# Patient Record
Sex: Male | Born: 1970 | Race: White | Hispanic: No | Marital: Single | Smoking: Former smoker
Health system: Southern US, Community
[De-identification: ages and names within clinical notes are randomized; demographics above are authoritative.]

## PROBLEM LIST (undated history)

## (undated) DIAGNOSIS — K219 Gastro-esophageal reflux disease without esophagitis: Secondary | ICD-10-CM

## (undated) HISTORY — PX: WISDOM TOOTH EXTRACTION: SHX21

## (undated) HISTORY — DX: Gastro-esophageal reflux disease without esophagitis: K21.9

---

## 2010-03-21 ENCOUNTER — Ambulatory Visit: Payer: Self-pay | Admitting: Internal Medicine

## 2010-03-21 DIAGNOSIS — K219 Gastro-esophageal reflux disease without esophagitis: Secondary | ICD-10-CM

## 2010-03-21 DIAGNOSIS — J4520 Mild intermittent asthma, uncomplicated: Secondary | ICD-10-CM | POA: Insufficient documentation

## 2010-03-21 DIAGNOSIS — J309 Allergic rhinitis, unspecified: Secondary | ICD-10-CM

## 2010-03-28 ENCOUNTER — Ambulatory Visit: Payer: Self-pay | Admitting: Internal Medicine

## 2010-03-31 LAB — CONVERTED CEMR LAB
CO2: 31 meq/L (ref 19–32)
Calcium: 9.6 mg/dL (ref 8.4–10.5)
GFR calc non Af Amer: 89.79 mL/min (ref >60)
HDL: 31 mg/dL — ABNORMAL LOW (ref >39.00)
Potassium: 4.4 meq/L (ref 3.5–5.1)
Sodium: 144 meq/L (ref 135–145)
Total CHOL/HDL Ratio: 5
VLDL: 22.4 mg/dL (ref 0.0–40.0)

## 2010-05-20 ENCOUNTER — Ambulatory Visit: Payer: Self-pay | Admitting: Family Medicine

## 2010-05-20 DIAGNOSIS — M25559 Pain in unspecified hip: Secondary | ICD-10-CM

## 2010-08-19 NOTE — Assessment & Plan Note (Signed)
Summary: ROA FOR FOLLOW-UP/JRR   Vital Signs:  Patient profile:   40 year old male Weight:      168.75 pounds Temp:     98.8 degrees F oral Pulse rate:   64 / minute Pulse rhythm:   regular BP sitting:   122 / 70  (left arm) Cuff size:   regular  Vitals Entered By: Selena Batten Dance CMA Duncan Dull) (May 20, 2010 4:39 PM) CC: Follow up   History of Present Illness: CC: f/u allergies, asthma  asthma - using advair sporadically (3x/wk) and albuterol as needed (very intermittently).    allergies - alllergic to pollens and grasses, weeds, dust.  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  notes allergies worse then, doesn't use mask.  using OTC claritin.  allergy sxs -drainage is main complaint.  notes some improvement with claritin.  has been on flonase in past.    hip pain - around father's day this year injured left hip region, thinks pulled muscle after helping unload gun safes from truck.  Pain worse with walking, not present with running or sitting/laying down.  no snapping of hip.  No radiation of pain, stays anterior hip region.  has tried naproxen and doesn't note much improvement.   -  Date:  04/11/2010    Flu vaccine given at work  Current Medications (verified): 1)  Naprosyn 375 Mg Tabs (Naproxen) .... Two Pills in Am For Knee Pain 2)  Claritin 10 Mg Tabs (Loratadine) .... One Daily For Allergies 3)  Zegerid 20-1100 Mg Caps (Omeprazole-Sodium Bicarbonate) .... As Needed Gerd 4)  Advair Diskus 100-50 Mcg/dose Aepb (Fluticasone-Salmeterol) .... One Puff Two Times A Day 5)  Ventolin Hfa 108 (90 Base) Mcg/act Aers (Albuterol Sulfate) .... 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 No apparent alopecia or balding. Eyes:  No corneal or conjunctival inflammation noted. EOMI. Perrla.  Ears:  External ear exam shows no significant lesions or deformities.  Otoscopic examination reveals clear canals, tympanic membranes are intact bilaterally without bulging, retraction, inflammation or discharge. Hearing is grossly normal bilaterally. Nose:  + nasal discharge Mouth:  Oral mucosa and oropharynx without lesions or exudates.  Teeth in good repair.  + lip rings x 2.  + cobblestoning Neck:  No deformities, masses, or tenderness noted. Lungs:  Normal respiratory effort, chest expands symmetrically. Lungs are clear to auscultation, no crackles or wheezes. Heart:  Normal rate and regular rhythm. S1 and S2 normal without gallop, murmur, click, rub or other extra sounds. Msk:  L hip pain anterior superior iliac spine.  no pain with abduction, extension of hip.  Mild pain with L internal rotation of hip, no pain with ext rotation.  Nontender bilateral greater trochanter, nontender SI joints.  Negative SLR.  No midline back pain. no paraspinous mm tenderness. Pulses:  2+ rad pulses Extremities:  no pedal edema   Impression & Recommendations:  Problem # 1:  ALLERGIC RHINITIS (ICD-477.9) cotninue claritin for now.  not interested in nasal steroid.  to let us know  if worsening.  will likely be issue while exposure continues.  advised could try mask when lawnmowing. His updated medication list for this problem includes:    Claritin 10 Mg Tabs (Loratadine) ..... One daily for allergies  Problem # 2:  ASTHMA (ICD-493.90) stable for now.  using advair intermittently, feels this is enough.  using albuterol  rarely. His updated medication list for this problem includes:    Advair Diskus 100-50 Mcg/dose Aepb (Fluticasone-salmeterol) ..... One puff two times a day    Ventolin Hfa 108 (90 Base) Mcg/act Aers (Albuterol sulfate) .Marland Kitchen... 2 puffs q4-6 hours as needed wheezing  Problem # 3:  HIP PAIN, LEFT (ICD-719.45) x 6 mo now.  treat for now as groin strain with exercises and handout provided, ice, NSAIDs.  RTC if not improved and consider xray vs tensor fascia lata or sartorius tendonitis?  r/o avulsion given chronicity if not improved.  His updated medication list for this problem includes:    Naprosyn 375 Mg Tabs (Naproxen) .Marland Kitchen..Marland Kitchen Two pills in am for knee pain  Complete Medication List: 1)  Naprosyn 375 Mg Tabs (Naproxen) .... Two pills in am for knee pain 2)  Claritin 10 Mg Tabs (Loratadine) .... One daily for allergies 3)  Zegerid 20-1100 Mg Caps (Omeprazole-sodium bicarbonate) .... As needed gerd 4)  Advair Diskus 100-50 Mcg/dose Aepb (Fluticasone-salmeterol) .... One puff two times a day 5)  Ventolin Hfa 108 (90 Base) Mcg/act Aers (Albuterol sulfate) .... 2 puffs q4-6 hours as needed wheezing  Patient Instructions: 1)  Return as needed or in 1 year for complete physical. 2)  Continue advair, albuterol as needed.  Continue claritin as needed. 3)  Return if allergies or asthma start acting up. 4)  Good to see you today, call clinic with questions   Orders Added: 1)  Est. Patient Level IV [43329]    Current Allergies (reviewed today): No known allergies

## 2010-08-19 NOTE — Assessment & Plan Note (Signed)
Summary: NEW PT TO EST/CLE   Vital Signs:  Patient profile:   40 year old male Height:      70 inches Weight:      164 pounds BMI:     23.62 Temp:     98.2 degrees F oral Pulse rate:   64 / minute Pulse rhythm:   regular BP sitting:   118 / 78  (left arm) Cuff size:   regular  Vitals Entered By: Selena Batten Dance CMA Duncan Dull) (March 21, 2010 9:32 AM) CC: New patient to establish care   History of Present Illness: CC establish, asthma  previously saw Dr. at [facility].  1. asthma - dx as child.  12 years ago, placed on advair.  Then asthma improved.  Now last few months notes worsening problem with allergies/asthma.  Notes tightness in chest and wheezing more than previously.  Last hospitalized for asthma 14 years ago, intubated x 1 around age 37.    2. allergies - allergic to pollens and grasses, weeds, dust.  Taking claritin daily (sometimes forgets).  Previously tried Careers adviser.    3. lump on bottom - 25 years now, seems to be growing, wants checked out.  No bleeding, itching.  Preventive Screening-Counseling & Management  Alcohol-Tobacco     Alcohol drinks/day: <1     Smoking Status: quit > 6 months  Caffeine-Diet-Exercise     Caffeine use/day: 3-4 cups (pepsi, tea)      Drug Use:  never.        Blood Transfusions:  no.    Current Medications (verified): 1)  Naprosyn 375 Mg Tabs (Naproxen) .... Two Pills in Am For Knee Pain 2)  Claritin 10 Mg Tabs (Loratadine) .... One Daily For Allergies 3)  Zegerid 20-1100 Mg Caps (Omeprazole-Sodium Bicarbonate) .... As Needed Gerd  Allergies (verified): No Known Drug Allergies  Past History:  Past Medical History: asthma (hosp age 59, intubation x1 age 67) GERD allergic rhinitis  Past Surgical History: wisdom tooth extraction  Family History: Adopted MGF - CAD/MI (s/p stents), CVA Mother - melanoma Parents - EtOH/drugs  No known cancers.  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  The patient denies anorexia, fever, weight loss, weight gain, vision loss, decreased hearing, chest pain, syncope, dyspnea on exertion, peripheral edema, prolonged cough, headaches, hemoptysis, abdominal pain, melena, hematochezia, severe indigestion/heartburn, hematuria, incontinence, muscle weakness, suspicious skin lesions, transient blindness, difficulty walking, depression, and testicular masses.         no nocturia  Physical Exam  General:  Well-developed,well-nourished,in no acute distress; alert,appropriate and cooperative throughout examination Head:  Normocephalic and atraumatic without obvious abnormalities. No apparent alopecia or balding. Eyes:  No corneal or conjunctival inflammation noted. EOMI. Perrla.  Ears:  External ear exam shows no significant lesions or deformities.  Otoscopic examination reveals clear canals, tympanic membranes are intact bilaterally without bulging, retraction, inflammation or discharge. Hearing is grossly normal bilaterally. Nose:  External nasal examination shows no deformity or inflammation. Nasal mucosa are pink and moist without lesions or exudates. Mouth:  Oral mucosa and oropharynx without lesions or exudates.  Teeth in good repair.  + lip rings x 2 Neck:  No deformities, masses,  or tenderness noted. Lungs:  Normal respiratory effort, chest expands symmetrically. Lungs are clear to auscultation, no crackles or wheezes. Heart:  Normal rate and regular rhythm. S1 and S2 normal without gallop, murmur, click, rub or other extra sounds. Abdomen:  Bowel sounds positive,abdomen soft and non-tender without masses, organomegaly or hernias noted. Msk:  No deformity  or scoliosis noted of thoracic or lumbar spine.   Pulses:  2+ rad pulses Extremities:  No clubbing, cyanosis, edema, or deformity noted with normal full range of motion of all joints.   Neurologic:  CN grossly intact, station and gait intact Skin:  Intact without suspicious lesions or rashes.  + fatty tumor left mid buttock   Impression & Recommendations:  Problem # 1:  HEALTH MAINTENANCE EXAM (ICD-V70.0)  Reviewed preventive care protocols, scheduled due services, and updated immunizations.  tetanus today, declines flu - to receive at work.  Problem # 2:  SCREENING FOR LIPOID DISORDERS (ICD-V77.91) check FLP and BMp fasting.  Problem # 3:  ALLERGIC RHINITIS (ICD-477.9)  His updated medication list for this problem includes:    Claritin 10 Mg Tabs (Loratadine) ..... One daily for allergies  Discussed use of allergy medications and environmental measures.   Problem # 4:  ASTHMA (ICD-493.90) rec start alb rescue inhaler, Mandella hold off on advair unless having issues not controlled by alb.  mild intermittent. His updated medication list for this problem includes:    Advair Diskus 100-50 Mcg/dose Aepb (Fluticasone-salmeterol) ..... One puff two times a day    Ventolin Hfa 108 (90 Base) Mcg/act Aers (Albuterol sulfate) .Marland Kitchen... 2 puffs q4-6 hours as needed wheezing  Problem # 5:  GERD (ICD-530.81)  His updated medication list for this problem includes:    Zegerid 20-1100 Mg Caps (Omeprazole-sodium bicarbonate) .Marland Kitchen... As needed gerd  Complete Medication List: 1)  Naprosyn 375 Mg Tabs (Naproxen) .... Two pills in am for knee pain 2)  Claritin 10 Mg Tabs (Loratadine) .... One daily for allergies 3)  Zegerid 20-1100 Mg Caps (Omeprazole-sodium bicarbonate) .... As needed gerd 4)  Advair Diskus 100-50 Mcg/dose Aepb (Fluticasone-salmeterol) .... One puff two times a day 5)  Ventolin Hfa 108 (90 Base) Mcg/act Aers (Albuterol sulfate) .... 2 puffs q4-6 hours as needed wheezing  Other  Orders: Tdap => 61yrs IM (27253) Admin 1st Vaccine (66440)  Patient Instructions: 1)  Please return in 1-2 months for asthma/allergies f/u 2)  Tetanus shot today. 3)  Come back fasting next week 8am for blood work [BMP, FLP v70.0] 4)  Pleasure to meet you today.  Call clinic with questions. Prescriptions: VENTOLIN HFA 108 (90 BASE) MCG/ACT AERS (ALBUTEROL SULFATE) 2 puffs q4-6 hours as needed wheezing  #1 x 3   Entered and Authorized by:   Eustaquio Boyden  MD   Signed by:   Eustaquio Boyden  MD on 03/21/2010   Method used:   [facility] to        [id number] ([facility]       [address]       [place], [place]  [address]       Ph: [phone] or [phone]       Fax: [phone]   RxID:   [id number] ADVAIR DISKUS 100-50 MCG/DOSE AEPB (FLUTICASONE-SALMETEROL) one puff two times a day  #1 x 3   Entered and Authorized by:   Eustaquio Boyden  MD   Signed by:   Eustaquio Boyden  MD on 03/21/2010   Method used:   [facility] to        [id number] ([facility]ail[facility]       [address]       [place], [place]  [address]       Ph: [phone] or [phone]       Fax: [phone]   RxID:   [id number]   Prior Medications: Current Allergies (reviewed today): No known allergies    Prevention & Chronic Care Immunizations   Influenza vaccine: Not documented   Influenza vaccine deferral: Deferred  (03/21/2010)    Tetanus booster: 03/21/2010: Tdap   Tetanus booster due: 03/21/2020    Pneumococcal vaccine: Not documented  Other Screening   Smoking status: quit > 6 months  (03/21/2010)  Lipids   Total Cholesterol: Not documented   LDL: Not documented   LDL Direct: Not documented   HDL: Not documented   Triglycerides: Not documented   Immunizations Administered:  Tetanus Vaccine:    Vaccine Type: Tdap    Site: right deltoid    Mfr: GlaxoSmithKline    Dose: 0.5 ml    Route: IM    Given by: Selena Batten Dance  CMA (AAMA)    Exp. Date: 04/09/2012    Lot #: [id number]    VIS given: 06/06/08 version given March 21, 2010.

## 2010-10-29 ENCOUNTER — Telehealth: Payer: Self-pay | Admitting: *Deleted

## 2010-10-29 DIAGNOSIS — J309 Allergic rhinitis, unspecified: Secondary | ICD-10-CM

## 2010-10-29 MED ORDER — FLUTICASONE PROPIONATE 50 MCG/ACT NA SUSP: 2.0000 | Freq: Every day | NASAL | Status: DC

## 2010-10-29 NOTE — Telephone Encounter (Signed)
Pt states his allergies have gotten bad this year and he is asking if something can be called in.  He says he discussed this with you at his office visit in the fall and you told him you would prescribe something.  [facility]  He has tried claritin, which has not helped.

## 2010-10-29 NOTE — Telephone Encounter (Signed)
Encourage to use mask when working outside. If claritin not helping consider changing to zyrtec or allegra. Sent in script for flonase to start as well. If not better, [facility] need ov.

## 2010-10-29 NOTE — Telephone Encounter (Signed)
Message left on cell phone to change to zyrtec or allegra. Also advised to wear mask and to pick up Rx. Advised to call for appt if no help.

## 2011-06-16 ENCOUNTER — Encounter: Payer: Self-pay | Admitting: *Deleted

## 2011-06-16 ENCOUNTER — Encounter: Payer: Self-pay | Admitting: Family Medicine

## 2011-06-16 ENCOUNTER — Ambulatory Visit (INDEPENDENT_AMBULATORY_CARE_PROVIDER_SITE_OTHER): Payer: BC Managed Care – PPO | Admitting: Family Medicine

## 2011-06-16 VITALS — BP 120/74 | HR 76 | Temp 98.5°F | Ht 71.0 in | Wt 171.5 lb

## 2011-06-16 DIAGNOSIS — J309 Allergic rhinitis, unspecified: Secondary | ICD-10-CM

## 2011-06-16 DIAGNOSIS — J069 Acute upper respiratory infection, unspecified: Secondary | ICD-10-CM

## 2011-06-16 DIAGNOSIS — R229 Localized swelling, mass and lump, unspecified: Secondary | ICD-10-CM | POA: Insufficient documentation

## 2011-06-16 MED ORDER — ALBUTEROL SULFATE HFA 108 (90 BASE) MCG/ACT IN AERS: 2.0000 | INHALATION_SPRAY | RESPIRATORY_TRACT | Status: DC | PRN

## 2011-06-16 MED ORDER — FLUTICASONE PROPIONATE 50 MCG/ACT NA SUSP: 2.0000 | Freq: Every day | NASAL | Status: DC

## 2011-06-16 MED ORDER — GUAIFENESIN-CODEINE 100-10 MG/5ML PO SYRP: 5.0000 mL | ORAL_SOLUTION | Freq: Two times a day (BID) | ORAL | Status: AC | PRN

## 2011-06-16 MED ORDER — FLUTICASONE-SALMETEROL 100-50 MCG/DOSE IN AEPB: 1.0000 | INHALATION_SPRAY | Freq: Two times a day (BID) | RESPIRATORY_TRACT | Status: DC

## 2011-06-16 NOTE — Assessment & Plan Note (Signed)
Anticipate lipoma.  Monitor for now.  Not bothering him. If growing in size, refer to surgery for eval/excision.

## 2011-06-16 NOTE — Patient Instructions (Addendum)
Sounds like you have a viral upper respiratory infection. Antibiotics are not needed for this.  Viral infections usually take 7-10 days to resolve.  The cough can last several weeks to go away. Use medication as prescribed: cheratussin for cough at night. Sass start simple mucinex with plenty of fluid to help mobilize mucous. Push fluids and plenty of rest. Please return if you are not improving as expected, or if you have high fevers (>101.5) or difficulty swallowing or worsening productive cough. Call clinic with questions.  Good to see you today. For bottom - we will keep eye on this.  5cm size today.  If growing, let me know for referral to surgeon.

## 2011-06-16 NOTE — Assessment & Plan Note (Signed)
Supportive care. No evidence of bacterial infection, or of asthma flare Lungs clear, no wheeze, good O2 sat. Update if not improving as expected. See pt instructions.  Sent in cheratussin for cough at night.

## 2011-06-16 NOTE — Progress Notes (Signed)
  Subjective:    Patient ID: Bruce Griffin, male    DOB: 07-Jun-1971, 40 y.o.   MRN: [id number]  HPI CC: cold  H/o asthma and allergies.  3d h/o head and chest cold.  Left early from work yesterday.  Head alternating congestion with RN.  Itchy eyes, sneezing.  Cough started today, mild productive of sputum.  + chest tightness.  Ribs hurting from coughing.  No wheezing or SOB, chest pain.  Using albuterol regularly for last few days.  Cough started today.  Tried OTC cold meds, alka seltzer.  No fevers/chills, abd pain, n/v/d, rashes, ear pain, tooth pain, joint pain.  Daughter recently with mild cough.  No smokers at home.  Quit 20 yrs ago.    lump on bottom thinks Asquith be growing, would like me to take a look.  25 years now, seems to be growing, wants checked out. No bleeding, itching.  Review of Systems Per HPI    Objective:   Physical Exam  Nursing note and vitals reviewed. Constitutional: He appears well-developed and well-nourished. No distress.       congested  HENT:  Head: Normocephalic and atraumatic.  Right Ear: Hearing, tympanic membrane, external ear and ear canal normal.  Left Ear: Hearing, tympanic membrane, external ear and ear canal normal.  Nose: Mucosal edema present. No rhinorrhea. Right sinus exhibits no maxillary sinus tenderness and no frontal sinus tenderness. Left sinus exhibits no maxillary sinus tenderness and no frontal sinus tenderness.  Mouth/Throat: Uvula is midline and mucous membranes are normal. Posterior oropharyngeal erythema present. No oropharyngeal exudate, posterior oropharyngeal edema or tonsillar abscesses.  Eyes: Conjunctivae and EOM are normal. Pupils are equal, round, and reactive to light. No scleral icterus.  Neck: Normal range of motion. Neck supple.  Cardiovascular: Normal rate, regular rhythm, normal heart sounds and intact distal pulses.   No murmur heard. Pulmonary/Chest: Effort normal and breath sounds normal. No respiratory distress.  He has no wheezes. He has no rales.  Lymphadenopathy:    He has no cervical adenopathy.  Skin: Skin is warm and dry. No rash noted.       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      Assessment & Plan:

## 2011-07-20 ENCOUNTER — Other Ambulatory Visit: Payer: Self-pay | Admitting: *Deleted

## 2011-07-20 MED ORDER — ALBUTEROL SULFATE HFA 108 (90 BASE) MCG/ACT IN AERS: 2.0000 | INHALATION_SPRAY | RESPIRATORY_TRACT | Status: DC | PRN

## 2011-07-20 NOTE — Telephone Encounter (Signed)
[facility]

## 2011-08-25 ENCOUNTER — Ambulatory Visit (INDEPENDENT_AMBULATORY_CARE_PROVIDER_SITE_OTHER): Payer: BC Managed Care – PPO | Admitting: Family Medicine

## 2011-08-25 ENCOUNTER — Encounter: Payer: Self-pay | Admitting: Family Medicine

## 2011-08-25 VITALS — BP 126/78 | HR 76 | Temp 98.8°F | Wt 170.2 lb

## 2011-08-25 DIAGNOSIS — J069 Acute upper respiratory infection, unspecified: Secondary | ICD-10-CM

## 2011-08-25 MED ORDER — GUAIFENESIN-CODEINE 100-10 MG/5ML PO SYRP: 5.0000 mL | ORAL_SOLUTION | Freq: Every evening | ORAL | Status: AC | PRN

## 2011-08-25 MED ORDER — ALBUTEROL SULFATE HFA 108 (90 BASE) MCG/ACT IN AERS: 2.0000 | INHALATION_SPRAY | Freq: Four times a day (QID) | RESPIRATORY_TRACT | Status: DC | PRN

## 2011-08-25 NOTE — Assessment & Plan Note (Signed)
sxs started today. Anticipate viral process.  Doubt related to allergic rhinitis. Supportive care.

## 2011-08-25 NOTE — Progress Notes (Signed)
  Subjective:    Patient ID: Bruce Griffin, male    DOB: Jun 02, 1971, 41 y.o.   MRN: [id number]  HPI CC: sinus infection?  41 yo with h/o asthma and allergies.  1d h/o sinus drainage and head congestion.  Main concern is constant drainage.  Started coughing this morning.  No wheezing or shortness of breath.  Has started taking simple mucinex with fluid.  Taking advair regularly and flonase, zyrtec regularly, as well as albuterol, benadryl prn.  No fevers/chills, abd pain, n/v, HA.  No ST.  No ear pain or tooth pain.  Sick contacts at home.  No smokers at home.    Does not want to stay sick.  Review of Systems Per HPI    Objective:   Physical Exam  Nursing note and vitals reviewed. Constitutional: He appears well-developed and well-nourished. No distress.  HENT:  Head: Normocephalic and atraumatic.  Right Ear: Hearing, tympanic membrane, external ear and ear canal normal.  Left Ear: Hearing, tympanic membrane, external ear and ear canal normal.  Nose: Nose normal. No mucosal edema or rhinorrhea. Right sinus exhibits no maxillary sinus tenderness and no frontal sinus tenderness. Left sinus exhibits no maxillary sinus tenderness and no frontal sinus tenderness.  Mouth/Throat: Uvula is midline and mucous membranes are normal. Posterior oropharyngeal erythema present. No oropharyngeal exudate, posterior oropharyngeal edema or tonsillar abscesses.       Mild cobblestoning, post nasal drainage  Eyes: Conjunctivae and EOM are normal. Pupils are equal, round, and reactive to light. No scleral icterus.  Neck: Normal range of motion. Neck supple.  Cardiovascular: Normal rate, regular rhythm, normal heart sounds and intact distal pulses.   No murmur heard. Pulmonary/Chest: Effort normal and breath sounds normal. No respiratory distress. He has no wheezes. He has no rales.       Lungs clear  Lymphadenopathy:    He has no cervical adenopathy.  Skin: Skin is warm and dry. No rash noted.       L  buttock with 4.5cm rubbery fatty tumor        Assessment & Plan:

## 2011-08-25 NOTE — Patient Instructions (Addendum)
Sounds like you have a viral upper respiratory infection. Antibiotics are not needed for this.  Viral infections usually take 7-10 days to resolve.  The cough can last several weeks to go away. Gerstenberger use codeine cough syrup. Nasal saline.  Look into airborne or vitamin C.  Push fluids and plenty of rest. Please return if you are not improving as expected, or if you have high fevers (>101.5) or difficulty swallowing or worsening productive cough. Call clinic with questions.  Good to see you today. Salt water gargles.

## 2011-08-27 ENCOUNTER — Telehealth: Payer: Self-pay | Admitting: Family Medicine

## 2011-08-27 MED ORDER — AZITHROMYCIN 250 MG PO TABS: ORAL_TABLET | ORAL | Status: AC

## 2011-08-27 NOTE — Telephone Encounter (Signed)
Bruce Griffin said he is not feeling any better, his chest is feeling tighter, has a low grade fever of 99.6 and has a lot of drainage. Last office visit was on 08/25/2011 and was advised to call back if not any better.

## 2011-08-27 NOTE — Telephone Encounter (Signed)
H/o asthma, will treat aggressively with zpack.  Update [place] if sxs not improved after this.  recommend regular albuterol use Q6 hours for next 2-3 days. rtc if any worsening.

## 2011-08-27 NOTE — Telephone Encounter (Signed)
Patient notified. He will call or come back in if no better or worsening.

## 2012-03-24 ENCOUNTER — Ambulatory Visit (INDEPENDENT_AMBULATORY_CARE_PROVIDER_SITE_OTHER): Payer: BC Managed Care – PPO | Admitting: Family Medicine

## 2012-03-24 ENCOUNTER — Encounter: Payer: Self-pay | Admitting: Family Medicine

## 2012-03-24 VITALS — BP 114/78 | HR 78 | Temp 98.6°F | Wt 159.8 lb

## 2012-03-24 DIAGNOSIS — J019 Acute sinusitis, unspecified: Secondary | ICD-10-CM | POA: Insufficient documentation

## 2012-03-24 MED ORDER — AMOXICILLIN-POT CLAVULANATE 875-125 MG PO TABS: 1.0000 | ORAL_TABLET | Freq: Two times a day (BID) | ORAL | Status: AC

## 2012-03-24 MED ORDER — PREDNISONE 20 MG PO TABS: ORAL_TABLET | ORAL | Status: DC

## 2012-03-24 NOTE — Progress Notes (Signed)
  Subjective:    Patient ID: Bruce Griffin, male    DOB: Mar 30, 1971, 41 y.o.   MRN: [id number]  HPI CC: sinusitis?  41 yo with h/o asthma/allergies presents with 3 week h/o sinus congestion.  Started with PNDrainage.  Never ST.  Then started with foul tasting sputum.  Occasional HA described as bilateral maxillary pressure.  Regularly takes zyrtec, flonase.  Trouble getting flonase in 2/2 congestion and swollen turbinates.  No fevers/chills, abd pain, n/v, ear or tooth pain.  Rare cough.  No smokers at home.  No sick contacts.  Review of Systems Per HPI    Objective:   Physical Exam  Nursing note and vitals reviewed. Constitutional: He appears well-developed and well-nourished. No distress.  HENT:  Head: Normocephalic and atraumatic.  Right Ear: Hearing, tympanic membrane, external ear and ear canal normal.  Left Ear: Hearing, tympanic membrane, external ear and ear canal normal.  Nose: Mucosal edema (R>L) present. No rhinorrhea. Right sinus exhibits no maxillary sinus tenderness and no frontal sinus tenderness. Left sinus exhibits no maxillary sinus tenderness and no frontal sinus tenderness.  Mouth/Throat: Uvula is midline and mucous membranes are normal. Posterior oropharyngeal erythema present. No oropharyngeal exudate, posterior oropharyngeal edema or tonsillar abscesses.       Mild cobblestoning. + R>L turbinate swelling and discharge  Eyes: Conjunctivae and EOM are normal. Pupils are equal, round, and reactive to light. No scleral icterus.  Neck: Normal range of motion. Neck supple.  Cardiovascular: Normal rate, regular rhythm, normal heart sounds and intact distal pulses.   No murmur heard. Pulmonary/Chest: Effort normal and breath sounds normal. No respiratory distress. He has no wheezes. He has no rales.       Lungs clear  Lymphadenopathy:    He has no cervical adenopathy.  Skin: Skin is warm and dry. No rash noted.       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

## 2012-03-24 NOTE — Assessment & Plan Note (Signed)
Given duration and progression of sxs, will treat as bacterial rhinosinusitis with course of augmentin. Prednisone given degree of edema on right turbinates.

## 2012-03-24 NOTE — Patient Instructions (Signed)
You have a sinus infection. Take medicine as prescribed: augmentin and prednisone Push fluids and plenty of rest. Nasal saline irrigation or neti pot to help drain sinuses. Ober use simple mucinex with plenty of fluid to help mobilize mucous. Let us know if fever >101.5, trouble opening/closing mouth, difficulty swallowing, or worsening - you Tauzin need to be seen again.

## 2012-04-03 ENCOUNTER — Other Ambulatory Visit: Payer: Self-pay | Admitting: Family Medicine

## 2012-04-03 DIAGNOSIS — E785 Hyperlipidemia, unspecified: Secondary | ICD-10-CM

## 2012-04-04 ENCOUNTER — Other Ambulatory Visit (INDEPENDENT_AMBULATORY_CARE_PROVIDER_SITE_OTHER): Payer: BC Managed Care – PPO

## 2012-04-04 DIAGNOSIS — E785 Hyperlipidemia, unspecified: Secondary | ICD-10-CM

## 2012-04-04 LAB — BASIC METABOLIC PANEL
BUN: 10 mg/dL (ref 6–23)
CO2: 31 mEq/L (ref 19–32)
Calcium: 9 mg/dL (ref 8.4–10.5)
Chloride: 106 mEq/L (ref 96–112)
Creatinine, Ser: 1 mg/dL (ref 0.4–1.5)
GFR: 88.91 mL/min (ref >60.00)

## 2012-04-04 LAB — LIPID PANEL
Cholesterol: 133 mg/dL (ref 0–200)
Triglycerides: 42 mg/dL (ref 0.0–149.0)

## 2012-04-07 ENCOUNTER — Encounter: Payer: Self-pay | Admitting: Family Medicine

## 2012-04-07 ENCOUNTER — Ambulatory Visit (INDEPENDENT_AMBULATORY_CARE_PROVIDER_SITE_OTHER): Payer: BC Managed Care – PPO | Admitting: Family Medicine

## 2012-04-07 VITALS — BP 110/62 | HR 74 | Temp 99.1°F | Ht 71.0 in | Wt 160.8 lb

## 2012-04-07 DIAGNOSIS — E785 Hyperlipidemia, unspecified: Secondary | ICD-10-CM

## 2012-04-07 DIAGNOSIS — Z0000 Encounter for general adult medical examination without abnormal findings: Secondary | ICD-10-CM | POA: Insufficient documentation

## 2012-04-07 DIAGNOSIS — J309 Allergic rhinitis, unspecified: Secondary | ICD-10-CM

## 2012-04-07 DIAGNOSIS — K219 Gastro-esophageal reflux disease without esophagitis: Secondary | ICD-10-CM

## 2012-04-07 DIAGNOSIS — J45909 Unspecified asthma, uncomplicated: Secondary | ICD-10-CM

## 2012-04-07 NOTE — Assessment & Plan Note (Signed)
Continue flonase, zyrtec 

## 2012-04-07 NOTE — Assessment & Plan Note (Signed)
Chronic, stable on zegerid, despite regular aleve use.

## 2012-04-07 NOTE — Progress Notes (Signed)
Subjective:    Patient ID: Bruce Griffin, male    DOB: 08-21-1970, 41 y.o.   MRN: [id number]  HPI CC: CPE  Recent sinusitis - significantly improved with prednisone and augmentin.  H/o allergies and asthma.  Placed on advair 2002.  Has taken regularly for last several months.  Has also taken sporadically and done well.  Allergies trigger asthma only now (dusty room, cold weather, strong perfumes).  Takes zyrtec and advair daily.  Takes aleve daily.  Last used albuterol several months ago.  H/o pneumonia as child.  GERD - takes zegerid every 3 days, doing well and sxs controlled.  No smokers at home.  Preventative: Flu shot - done at work. Tetanus 2011. Seat belt use discussed. Sunscreen use use discussed. Mother with h/o melanoma (age 55s). Father with rectal cancer dx 58yo.  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  Past histories reviewed and updated if relevant as below. Patient Active Problem List  Diagnosis  . ALLERGIC RHINITIS  . ASTHMA  . GERD  . HIP PAIN, LEFT  . Skin mass  . Sinusitis acute  . Dyslipidemia   Past Medical History  Diagnosis Date  . Asthma     hospitalized with intubation at age 56  . GERD (gastroesophageal reflux disease)   . Allergic rhinitis    Past Surgical History  Procedure Date  . Wisdom tooth extraction    History  Substance Use Topics  . Smoking status: Former Smoker    Quit date: 07/20/1988  . Smokeless tobacco: Former Neurosurgeon  . Alcohol Use: No     stopped 07/20/2009   Family History  Problem Relation Age of Onset  . Adopted: Yes  . Heart attack Maternal Grandfather     stents  . Coronary artery disease Maternal Grandfather   . Stroke  Maternal Grandfather   . Melanoma Mother   . Alcohol abuse Mother   . Alcohol abuse Father   . Drug abuse Mother   . Drug abuse Father   . Cancer Father 29    rectal or prostate   No Known Allergies Current Outpatient Prescriptions on File Prior to Visit  Medication Sig Dispense Refill  . cetirizine (ZYRTEC) 10 MG tablet Take 10 mg by mouth daily.       . Cyanocobalamin (VITAMIN B 12 PO) Take 1 tablet by mouth daily.      . fluticasone (FLONASE) 50 MCG/ACT nasal spray Place 2 sprays into the nose daily.  16 g  11  . Fluticasone-Salmeterol (ADVAIR DISKUS) 100-50 MCG/DOSE AEPB Inhale 1 puff into the lungs every 12 (twelve) hours.  60 each  11  . Lysine 500 MG CAPS Take 1 capsule by mouth 2 (two) times daily.      . Melatonin 3 MG TABS Take 2 tablets by mouth at bedtime.      . naproxen (NAPROSYN) 375 MG tablet Take 500 mg by mouth 2 (two) times daily with a meal.       . Omeprazole-Sodium Bicarbonate (ZEGERID) 20-1100 MG CAPS Take 1 capsule by mouth every 3 (three) days.       Marland Kitchen albuterol (VENTOLIN HFA) 108 (90 BASE) MCG/ACT inhaler Inhale 2 puffs into the lungs every 6 (six) hours as needed for wheezing.  1  Inhaler  11  . diphenhydrAMINE (BENADRYL) 25 MG tablet Take 25 mg by mouth as needed.         Review of Systems  Constitutional: Negative for fever, chills, activity change, appetite change, fatigue and unexpected weight change.  HENT: Negative for hearing loss and neck pain.   Eyes: Negative for visual disturbance.  Respiratory: Negative for cough, chest tightness, shortness of breath and wheezing.   Cardiovascular: Negative for chest pain, palpitations and leg swelling.  Gastrointestinal: Positive for diarrhea (from recent abx). Negative for nausea, vomiting, abdominal pain, constipation, blood in stool and abdominal distention.  Genitourinary: Negative for hematuria and difficulty urinating.  Musculoskeletal: Negative for myalgias and arthralgias.  Skin: Negative for rash.    Neurological: Positive for headaches. Negative for dizziness, seizures and syncope.  Psychiatric/Behavioral: Negative for dysphoric mood. The patient is not nervous/anxious.        Objective:   Physical Exam  Nursing note and vitals reviewed. Constitutional: He is oriented to person, place, and time. He appears well-developed and well-nourished. No distress.  HENT:  Head: Normocephalic and atraumatic.  Right Ear: Hearing, tympanic membrane, external ear and ear canal normal.  Left Ear: Hearing, tympanic membrane, external ear and ear canal normal.  Nose: Nose normal.  Mouth/Throat: Oropharynx is clear and moist. No oropharyngeal exudate.  Eyes: Conjunctivae normal and EOM are normal. Pupils are equal, round, and reactive to light. No scleral icterus.  Neck: Normal range of motion. Neck supple.  Cardiovascular: Normal rate, regular rhythm, normal heart sounds and intact distal pulses.   No murmur heard. Pulses:      Radial pulses are 2+ on the right side, and 2+ on the left side.  Pulmonary/Chest: Effort normal and breath sounds normal. No respiratory distress. He has no wheezes. He has no rales.  Abdominal: Soft. Bowel sounds are normal. He exhibits no distension and no mass. There is no tenderness. There is no rebound and no guarding.  Musculoskeletal: Normal range of motion. He exhibits no edema.  Lymphadenopathy:    He has no cervical adenopathy.  Neurological: He is alert and oriented to person, place, and time.       CN grossly intact, station and gait intact  Skin: Skin is warm and dry. No rash noted.  Psychiatric: He has a normal mood and affect. His behavior is normal. Judgment and thought content normal.       Assessment & Plan:

## 2012-04-07 NOTE — Assessment & Plan Note (Signed)
longterm control of asthma. Has been compliant with advair. Discussed backing off slowly and see if still controlled asthma. Decrease advair to QD for next several weeks, then trial off.

## 2012-04-07 NOTE — Assessment & Plan Note (Signed)
Preventative protocols reviewed and updated unless pt declined. Discussed healthy diet and lifestyle.  

## 2012-04-07 NOTE — Assessment & Plan Note (Signed)
Significantly improved numbers - will remove this dx from list.  Reviewed #s with pt.

## 2012-04-07 NOTE — Patient Instructions (Addendum)
Things looking good today.  Blood work looking good. Good to see you today, call us with questions.

## 2012-04-27 ENCOUNTER — Telehealth: Payer: Self-pay | Admitting: *Deleted

## 2012-04-27 NOTE — Telephone Encounter (Signed)
Noted. Agree.

## 2012-04-27 NOTE — Telephone Encounter (Signed)
Patient called and said he started feeling some sinus congestion, drainage and very mild ST yesterday and was asking for abx to be called in since he was just seen not too long ago. He said he felt better after that visit and then things started up again yesterday. Advised that more than likely things were viral at this point. I advised to use mucinex with plenty of water, tylenol or ibuprofen for discomfort and a nasal saline spray to flush sinuses. I advised to call back if no better after 7 days or if he started running a temp. He verbalized understanding.

## 2012-10-24 ENCOUNTER — Ambulatory Visit (INDEPENDENT_AMBULATORY_CARE_PROVIDER_SITE_OTHER): Payer: BC Managed Care – PPO | Admitting: Family Medicine

## 2012-10-24 ENCOUNTER — Encounter: Payer: Self-pay | Admitting: Family Medicine

## 2012-10-24 ENCOUNTER — Encounter: Payer: Self-pay | Admitting: *Deleted

## 2012-10-24 VITALS — BP 128/78 | HR 76 | Temp 98.0°F | Wt 159.2 lb

## 2012-10-24 DIAGNOSIS — IMO0002 Reserved for concepts with insufficient information to code with codable children: Secondary | ICD-10-CM

## 2012-10-24 DIAGNOSIS — S76112A Strain of left quadriceps muscle, fascia and tendon, initial encounter: Secondary | ICD-10-CM

## 2012-10-24 DIAGNOSIS — S76119A Strain of unspecified quadriceps muscle, fascia and tendon, initial encounter: Secondary | ICD-10-CM | POA: Insufficient documentation

## 2012-10-24 MED ORDER — NAPROXEN 500 MG PO TABS: ORAL_TABLET | ORAL | Status: DC

## 2012-10-24 NOTE — Patient Instructions (Addendum)
You have a quadriceps strain/tear. Treat with heat to quads, we will wrap in ace bandage (compression will provide extra support), and Michaelis use anti inflammatory prescribed.  Ok to take with tylenol but don't take with any other med. If not getting better with this, we will set you up with physical therapy. Take it easy over next few weeks. Do strecthing execises provided.

## 2012-10-24 NOTE — Progress Notes (Signed)
  Subjective:    Patient ID: Bruce Griffin, male    DOB: 1971/04/07, 42 y.o.   MRN: [id number]  HPI CC: left leg injury  DOI: 3/22 or 23/2014 2 wks ago when walking down steps while carrying something, took misstep, possibly with left foot rotated outward, and felt tearing in front of left thigh along entire quad muscle.  Associated with swelling of thigh, bruising.  Treated with ice/heat, advil, tylenol, naprosyn and bc powders.    6 days ago stood up from ground with full weight on left leg and felt re -injury/tear.    Now with persistent 3/10 pain.  Denies numbness or tingling of left leg. Has never injured that leg in past.  Past Medical History  Diagnosis Date  . Asthma     hospitalized with intubation at age 71  . GERD (gastroesophageal reflux disease)   . Allergic rhinitis     Review of Systems Per HPI    Objective:   Physical Exam  Nursing note and vitals reviewed. Constitutional: He appears well-developed and well-nourished. No distress.  Musculoskeletal:       Right upper leg: Normal.       Left upper leg: He exhibits tenderness.  Left thigh - no obvious edema Tender to palpation anterior quads, mainly at rectus femoris. Tender with hip flexion Tight with full flexion of knee No pain with abduction of hip.  Neurological:  5/5 strength knee flexion/extension 4+/5 strength hip flexion (2/2 pain)  Skin: Skin is warm and dry. Bruising noted.     Bruising present left medial thigh       Assessment & Plan:

## 2012-10-24 NOTE — Assessment & Plan Note (Signed)
Anticipate rectus femoris strain/tear. Treat with compression (ace bandage), heat, naprosyn, and stretching exercises provided from [facility] pt advisor. Offered PT, pt declines due to time constraints. Recommended no heavy lifting for next 2 weeks. If not improving as expected, to update me for referral to PT. pt agrees with plan.

## 2012-12-05 ENCOUNTER — Encounter: Payer: Self-pay | Admitting: Family Medicine

## 2012-12-05 ENCOUNTER — Ambulatory Visit (INDEPENDENT_AMBULATORY_CARE_PROVIDER_SITE_OTHER): Payer: BC Managed Care – PPO | Admitting: General Surgery

## 2012-12-05 ENCOUNTER — Encounter (INDEPENDENT_AMBULATORY_CARE_PROVIDER_SITE_OTHER): Payer: Self-pay | Admitting: General Surgery

## 2012-12-05 ENCOUNTER — Ambulatory Visit (INDEPENDENT_AMBULATORY_CARE_PROVIDER_SITE_OTHER): Payer: BC Managed Care – PPO | Admitting: Family Medicine

## 2012-12-05 VITALS — BP 112/70 | HR 72 | Temp 98.3°F | Wt 164.0 lb

## 2012-12-05 VITALS — BP 118/78 | HR 66 | Temp 98.7°F | Resp 12 | Ht 71.0 in | Wt 163.5 lb

## 2012-12-05 DIAGNOSIS — L089 Local infection of the skin and subcutaneous tissue, unspecified: Secondary | ICD-10-CM | POA: Insufficient documentation

## 2012-12-05 DIAGNOSIS — L723 Sebaceous cyst: Secondary | ICD-10-CM

## 2012-12-05 DIAGNOSIS — R229 Localized swelling, mass and lump, unspecified: Secondary | ICD-10-CM

## 2012-12-05 MED ORDER — CEPHALEXIN 500 MG PO CAPS: 500.0000 mg | ORAL_CAPSULE | Freq: Three times a day (TID) | ORAL | Status: DC

## 2012-12-05 NOTE — Progress Notes (Signed)
  Subjective:    Patient ID: Bruce Griffin, male    DOB: 1971/04/17, 42 y.o.   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  Last measured 05/2011 and ~5cm. Over last several days, enlarging and more irritated. Has been treating with warm compresses and naprosyn and tylenol. Warm, red, tender.  Pt without fever, nausea/vomiting.  Past Medical History  Diagnosis Date  . Asthma     hospitalized with intubation at age 69  . GERD (gastroesophageal reflux disease)   . Allergic rhinitis      Review of Systems Per HPI    Objective:   Physical Exam  Nursing note and vitals reviewed. Constitutional: He appears well-developed and well-nourished. No distress.  Skin: There is erythema.     Warm, erythematous mass on left buttock measuring 7x8 cm.  Indurated, no fluctuance appreciated.       Assessment & Plan:

## 2012-12-05 NOTE — Progress Notes (Signed)
Patient ID: Bruce Griffin, male   DOB: 07/29/1970, 42 y.o.   MRN: [id number]  Chief Complaint  Patient presents with  . Abscess    HPI Bruce Griffin is a 42 y.o. male.  He is referred by Dr. Sharen Hones at [facility] for an abscess of his left buttock and gluteal area.  This gentleman states that he has had a lump on his left buttock for 8 years. He says this has been 2-3 cm in size but has never given any trouble until recently. Late last week it became tender and enlarged and  has not drained. He was started on Keflex today and referred over here.  Comorbidities include asthma and GERD.   Abscess Associated symptoms include coughing. Pertinent negatives include no abdominal pain, arthralgias, chest pain, chills, congestion, fever, headaches, nausea, rash, sore throat, vomiting or weakness.    Past Medical History  Diagnosis Date  . Asthma     hospitalized with intubation at age 72  . GERD (gastroesophageal reflux disease)   . Allergic rhinitis     Past Surgical History  Procedure Laterality Date  . Wisdom tooth extraction      Family History  Problem Relation Age of Onset  . Adopted: Yes  . Heart attack Maternal Grandfather     stents  . Coronary artery disease Maternal Grandfather   . Stroke Maternal Grandfather   . Melanoma Mother   . Alcohol abuse Mother   . Alcohol abuse Father   . Drug abuse Mother   . Drug abuse Father   . Cancer Father 36    rectal    Social History History  Substance Use Topics  . Smoking status: Former Smoker    Quit date: 07/20/1988  . Smokeless tobacco: Former Neurosurgeon  . Alcohol Use: No     Comment: stopped 07/20/2009    No Known Allergies  Current Outpatient Prescriptions  Medication Sig Dispense Refill  . albuterol (VENTOLIN HFA) 108 (90 BASE) MCG/ACT inhaler Inhale 2 puffs into the lungs every 6 (six) hours as needed for wheezing.  1 Inhaler  11  . Ascorbic Acid (VITAMIN C) 1000 MG tablet Take 1,000 mg by mouth daily.      Marland Kitchen  BIOTIN PO Take 3 tablets by mouth daily.      . cephALEXin (KEFLEX) 500 MG capsule Take 1 capsule (500 mg total) by mouth 3 (three) times daily.  30 capsule  0  . cetirizine (ZYRTEC) 10 MG tablet Take 10 mg by mouth daily.       . Cyanocobalamin (VITAMIN B 12 PO) Take 1 tablet by mouth daily.      . diphenhydrAMINE (BENADRYL) 25 MG tablet Take 25 mg by mouth as needed.      . fluticasone (FLONASE) 50 MCG/ACT nasal spray Place 2 sprays into the nose daily.  16 g  11  . Fluticasone-Salmeterol (ADVAIR DISKUS) 100-50 MCG/DOSE AEPB Inhale 1 puff into the lungs every 12 (twelve) hours.  60 each  11  . Lysine 500 MG CAPS Take 1 capsule by mouth 2 (two) times daily.      . Melatonin 3 MG TABS Take 2 tablets by mouth at bedtime.      . naproxen (NAPROSYN) 500 MG tablet Take one po bid x 1 week then prn pain, take with food  60 tablet  0  . Omeprazole-Sodium Bicarbonate (ZEGERID) 20-1100 MG CAPS Take 1 capsule by mouth every 3 (three) days.        No  current facility-administered medications for this visit.    Review of Systems Review of Systems  Constitutional: Negative for fever, chills and unexpected weight change.  HENT: Negative for hearing loss, congestion, sore throat, trouble swallowing and voice change.   Eyes: Negative for visual disturbance.  Respiratory: Positive for cough. Negative for wheezing.   Cardiovascular: Negative for chest pain, palpitations and leg swelling.  Gastrointestinal: Negative for nausea, vomiting, abdominal pain, diarrhea, constipation, blood in stool, abdominal distention, anal bleeding and rectal pain.  Genitourinary: Negative for hematuria and difficulty urinating.  Musculoskeletal: Negative for arthralgias.  Skin: Positive for color change. Negative for rash and wound.  Neurological: Negative for seizures, syncope, weakness and headaches.  Hematological: Negative for adenopathy. Does not bruise/bleed easily.  Psychiatric/Behavioral: Negative for confusion.     Blood pressure 118/78, pulse 66, temperature 98.7 F (37.1 C), temperature source Oral, resp. rate 12, height 5\' 11"  (1.803 m), weight 163 lb 8 oz (74.163 kg).  Physical Exam Physical Exam  Constitutional: He is oriented to person, place, and time. He appears well-developed and well-nourished. No distress.  HENT:  Head: Normocephalic and atraumatic.  Abdominal: Soft. There is no tenderness.  Musculoskeletal: Normal range of motion. He exhibits no edema and no tenderness.  Neurological: He is alert and oriented to person, place, and time. Coordination normal.  Skin: Skin is warm and dry. No rash noted. He is not diaphoretic. There is erythema. No pallor.  In the left gluteal area there is a 7-8 cm area of induration and erythema. There is a 3 cm fluctuant area which the patient states is where the cyst has been for many years. This area was prepped with Betadine. Anesthetized with 1% Xylocaine. A 4 cm incision was made and I evacuated a very large abscessed sebaceous cyst. Cultures taken. I sharply debrided the cyst wall with a knife. Packed with iodoform gauze. Minimal bleeding tolerated well    Data Reviewed Office notes from [facility].  Assessment    4 cm abscessed sebaceous cyst, left gluteal area  Asthma  GERD     Plan    Incision,  drainage, and debridement of the abscessed sebaceous cyst left gluteal area, 4 cm diameter. Complex.  Wound care instructions given.  warm tub baths twice a day. Remove the packing tomorrow and do not repack;  cover with dry gauze. Continue Keflex until prescription is completely gone  Vicodin ( #35)  prescription given  Return to see me in one week, sooner if there are problems.        Angelia Mould. Derrell Lolling, M.D., [facility] General and Minimally invasive Surgery Breast and Colorectal Surgery Office:   [phone] Pager:   [phone]  12/05/2012, 4:21 PM

## 2012-12-05 NOTE — Patient Instructions (Addendum)
Start keflex three times daily for 10 days. Looks like some infection next to lipoma, but could also be changing lipoma so I recommend evaluation by surgery. Pass by Marion's office to schedule appointment, possibly today.

## 2012-12-05 NOTE — Patient Instructions (Signed)
You had a large, infected, abscessed sebaceous cyst of the left buttock. This cyst was probably 4 cm in diameter. We drained this completely and also cut out most of the cyst wall. The cyst cavity was packed with gauze.  Tomorrow morning, take a warm tub bath and slowly pull the gauze packing out and through the  gauze away. Do not repack this area.  Cover the open wound with lots of dry gauze.  Take a warm tub baths twice a day and cover the wound with dry gauze.  Continue the antibiotics until they are all gone..  The infection should clearly improve over the next few days. It should feel much better in 48 hours.  As long as everything is getting well, return to see Dr. Derrell Lolling in one week. You will be given an appointment.

## 2012-12-05 NOTE — Assessment & Plan Note (Signed)
Prior thought lipoma - now enlarging and concern for secondary cellulitis/abscess, however nothing to drain today. Discussed plan with patient.  He is hopeful to minimize referrals/imaging 2/2 concern for being out of work. Will treat with keflex for 10 d course and refer to surgery clinic given concern for growing /changing lipoma - Kindall need US/excision.

## 2012-12-07 ENCOUNTER — Encounter (INDEPENDENT_AMBULATORY_CARE_PROVIDER_SITE_OTHER): Payer: Self-pay | Admitting: General Surgery

## 2012-12-08 LAB — WOUND CULTURE: Gram Stain: NONE SEEN

## 2012-12-14 ENCOUNTER — Encounter (INDEPENDENT_AMBULATORY_CARE_PROVIDER_SITE_OTHER): Payer: Self-pay | Admitting: General Surgery

## 2012-12-14 ENCOUNTER — Ambulatory Visit (INDEPENDENT_AMBULATORY_CARE_PROVIDER_SITE_OTHER): Payer: BC Managed Care – PPO | Admitting: General Surgery

## 2012-12-14 VITALS — BP 126/74 | HR 78 | Temp 98.2°F | Resp 18 | Ht 70.0 in | Wt 162.0 lb

## 2012-12-14 DIAGNOSIS — L723 Sebaceous cyst: Secondary | ICD-10-CM

## 2012-12-14 NOTE — Progress Notes (Signed)
Patient ID: Bruce Griffin, male   DOB: 06-28-1971, 42 y.o.   MRN: [id number] History: This gentleman underwent incision, drainage, and debridement of an abscessed sebaceous cyst of the left gluteal area on [address]. He has done very well. He states the pain is gone and the wound is healing rapidly. He is taking care of it as instructed.  Exam: Patient looks well. No distress Left gluteal wound shows incision about 2 cm in length. Much smaller. Nontender. No induration. No purulence. The mass.  Assessment: Abscessed sebaceous cyst left gluteal area, status post incision drainage and debridement. Recovering uneventfully  Plan: Continue wound care. Twice a day showers. Dry gauze. No ointment Return to see me if this does not heal completely and 3 more weeks.   Angelia Mould. Derrell Lolling, M.D., [facility] General and Minimally invasive Surgery Breast and Colorectal Surgery Office:   [phone] Pager:   [phone]

## 2012-12-14 NOTE — Patient Instructions (Signed)
The large, abscessed sebaceous cyst of the left gluteal area is now healing rapidly following the surgical procedure that we performed last week.  Continue to shower twice daily and cover the wound was clean, dry gauze, and do not use any ointments.  Return to see Dr. Derrell Lolling if this does not completely heal in 3 weeks.

## 2013-05-25 ENCOUNTER — Other Ambulatory Visit: Payer: Self-pay

## 2013-10-05 ENCOUNTER — Ambulatory Visit (INDEPENDENT_AMBULATORY_CARE_PROVIDER_SITE_OTHER): Payer: BC Managed Care – PPO | Admitting: Family Medicine

## 2013-10-05 ENCOUNTER — Encounter: Payer: Self-pay | Admitting: Family Medicine

## 2013-10-05 VITALS — BP 130/90 | HR 90 | Temp 98.6°F | Wt 163.8 lb

## 2013-10-05 DIAGNOSIS — J309 Allergic rhinitis, unspecified: Secondary | ICD-10-CM

## 2013-10-05 DIAGNOSIS — J45909 Unspecified asthma, uncomplicated: Secondary | ICD-10-CM

## 2013-10-05 MED ORDER — FLUTICASONE PROPIONATE 50 MCG/ACT NA SUSP: 2.0000 | Freq: Every day | NASAL | Status: DC

## 2013-10-05 MED ORDER — FLUTICASONE-SALMETEROL 100-50 MCG/DOSE IN AEPB: 1.0000 | INHALATION_SPRAY | Freq: Two times a day (BID) | RESPIRATORY_TRACT | Status: DC

## 2013-10-05 MED ORDER — PREDNISONE 20 MG PO TABS: ORAL_TABLET | ORAL | Status: DC

## 2013-10-05 MED ORDER — ALBUTEROL SULFATE HFA 108 (90 BASE) MCG/ACT IN AERS: 2.0000 | INHALATION_SPRAY | Freq: Four times a day (QID) | RESPIRATORY_TRACT | Status: DC | PRN

## 2013-10-05 NOTE — Assessment & Plan Note (Signed)
Doubt sinus infection. Anticipate return of seasonal allergic rhinitis - treat with steroid course as well as restarting flonase.  See pt instructions. Handout provided.

## 2013-10-05 NOTE — Assessment & Plan Note (Signed)
Anticipate seasonal allergies leading to worsening control of asthma.  Treat with prednisone course, restart advair, continue albuterol prn.

## 2013-10-05 NOTE — Progress Notes (Signed)
Pre visit review using our clinic review tool, if applicable. No additional management support is needed unless otherwise documented below in the visit note. 

## 2013-10-05 NOTE — Progress Notes (Signed)
BP 130/90  Pulse 90  Temp(Src) 98.6 F (37 C) (Oral)  Wt 163 lb 12.8 oz (74.299 kg)   CC: URI?  Subjective:    Patient ID: Bruce Griffin, male    DOB: 08/25/1970, 43 y.o.   MRN: [id number]  HPI: Bruce Griffin is a 43 y.o. male presenting on 10/05/2013 for URI   7d h/o dry hacking cough, progressively worse since then.  + chest tightness and dyspnea, watery eyes, rhinorrhea, nasal congestion, some ST from [place].    No ear or tooth pain, fevers/chills, abd pain, nausea, myalgias, headache, wheeze  H/o asthma and allergic rhinitis.  advair slowly titrated down to 1 puff, then didn't refill in last few months 2/2 good asthma control.  Increased albuterol use in the mornings. Mom recently with PNA, daughter sick as well. No smokers at home.  Relevant past medical, surgical, family and social history reviewed and updated as indicated.  Allergies and medications reviewed and updated. Current Outpatient Prescriptions on File Prior to Visit  Medication Sig  . BIOTIN PO Take 3 tablets by mouth daily.  . cetirizine (ZYRTEC) 10 MG tablet Take 10 mg by mouth daily.   . diphenhydrAMINE (BENADRYL) 25 MG tablet Take 25 mg by mouth as needed.  Marland Kitchen Lysine 500 MG CAPS Take 1 capsule by mouth 2 (two) times daily.  . naproxen (NAPROSYN) 500 MG tablet Take one po bid x 1 week then prn pain, take with food  . Omeprazole-Sodium Bicarbonate (ZEGERID) 20-1100 MG CAPS Take 1 capsule by mouth every 3 (three) days.   . Ascorbic Acid (VITAMIN C) 1000 MG tablet Take 1,000 mg by mouth daily.  . Cyanocobalamin (VITAMIN B 12 PO) Take 1 tablet by mouth daily.  Marland Kitchen HYDROcodone-acetaminophen (VICODIN) 5-500 MG per tablet    No current facility-administered medications on file prior to visit.    Review of Systems Per HPI unless specifically indicated above    Objective:    BP 130/90  Pulse 90  Temp(Src) 98.6 F (37 C) (Oral)  Wt 163 lb 12.8 oz (74.299 kg)  Physical Exam  Nursing note and vitals  reviewed. Constitutional: He appears well-developed and well-nourished. No distress.  HENT:  Head: Normocephalic and atraumatic.  Right Ear: Hearing, tympanic membrane, external ear and ear canal normal.  Left Ear: Hearing, tympanic membrane, external ear and ear canal normal.  Nose: Mucosal edema and rhinorrhea present. Right sinus exhibits no maxillary sinus tenderness and no frontal sinus tenderness. Left sinus exhibits no maxillary sinus tenderness and no frontal sinus tenderness.  Mouth/Throat: Uvula is midline, oropharynx is clear and moist and mucous membranes are normal. No oropharyngeal exudate, posterior oropharyngeal edema, posterior oropharyngeal erythema or tonsillar abscesses.  R swollen turbinates Oropharyngeal cobblestoning  Eyes: Conjunctivae and EOM are normal. Pupils are equal, round, and reactive to light. No scleral icterus.  Neck: Normal range of motion. Neck supple.  Cardiovascular: Normal rate, regular rhythm, normal heart sounds and intact distal pulses.   No murmur heard. Pulmonary/Chest: Effort normal and breath sounds normal. No respiratory distress. He has no wheezes. He has no rales.  Sl tight  Lymphadenopathy:    He has no cervical adenopathy.  Skin: Skin is warm and dry. No rash noted.       Assessment & Plan:   Problem List Items Addressed This Visit   ALLERGIC RHINITIS - Primary     Doubt sinus infection. Anticipate return of seasonal allergic rhinitis - treat with steroid course as well as restarting flonase.  See pt instructions. Handout provided.    Relevant Medications      fluticasone (FLONASE) 50 MCG/ACT nasal spray   ASTHMA     Anticipate seasonal allergies leading to worsening control of asthma.  Treat with prednisone course, restart advair, continue albuterol prn.    Relevant Medications      albuterol (VENTOLIN HFA) 108 (90 BASE) MCG/ACT inhaler      Fluticasone-Salmeterol (ADVAIR DISKUS) 100-50 MCG/DOSE AEPB      predniSONE (DELTASONE)  tablet       Follow up plan: Return if symptoms worsen or fail to improve.

## 2013-10-05 NOTE — Patient Instructions (Signed)
I think this is an allergy flare - continue zyrtec, restart flonase, and albuterol as needed.  Prednisone course sent to pharmacy as well. Restart advair after done with prednisone. Watch for fever >101, worsening productive cough.  Hay Fever Hay fever is an allergic reaction to particles in the air. It cannot be passed from person to person. It cannot be cured, but it can be controlled. CAUSES  Hay fever is caused by something that triggers an allergic reaction (allergens). The following are examples of allergens:  Ragweed.  Feathers.  Animal dander.  Grass and tree pollens.  Cigarette smoke.  House dust.  Pollution. SYMPTOMS   Sneezing.  Runny or stuffy nose.  Tearing eyes.  Itchy eyes, nose, mouth, throat, skin, or other area.  Sore throat.  Headache.  Decreased sense of smell or taste. DIAGNOSIS Your caregiver will perform a physical exam and ask questions about the symptoms you are having.Allergy testing Lung be done to determine exactly what triggers your hay fever.  TREATMENT   Over-the-counter medicines Messer help symptoms. These include:  Antihistamines.  Decongestants. These Bin help with nasal congestion.  Your caregiver Tesch prescribe medicines if over-the-counter medicines do not work.  Some people benefit from allergy shots when other medicines are not helpful. HOME CARE INSTRUCTIONS   Avoid the allergen that is causing your symptoms, if possible.  Take all medicine as told by your caregiver. SEEK MEDICAL CARE IF:   You have severe allergy symptoms and your current medicines are not helping.  Your treatment was working at one time, but you are now experiencing symptoms.  You have sinus congestion and pressure.  You develop a fever or headache.  You have thick nasal discharge.  You have asthma and have a worsening cough and wheezing. SEEK IMMEDIATE MEDICAL CARE IF:   You have swelling of your tongue or lips.  You have trouble  breathing.  You feel lightheaded or like you are going to faint.  You have cold sweats.  You have a fever. Document Released: 07/06/2005 Document Revised: 09/28/2011 Document Reviewed: 10/01/2010 [facility] Patient Information 2014 [place].

## 2014-01-03 ENCOUNTER — Other Ambulatory Visit: Payer: Self-pay | Admitting: Family Medicine

## 2014-01-03 ENCOUNTER — Other Ambulatory Visit (INDEPENDENT_AMBULATORY_CARE_PROVIDER_SITE_OTHER): Payer: BC Managed Care – PPO

## 2014-01-03 DIAGNOSIS — Z0000 Encounter for general adult medical examination without abnormal findings: Secondary | ICD-10-CM

## 2014-01-03 LAB — COMPREHENSIVE METABOLIC PANEL
ALBUMIN: 4.2 g/dL (ref 3.5–5.2)
ALK PHOS: 33 U/L — AB (ref 39–117)
ALT: 21 U/L (ref 0–53)
AST: 19 U/L (ref 0–37)
BILIRUBIN TOTAL: 0.5 mg/dL (ref 0.2–1.2)
BUN: 18 mg/dL (ref 6–23)
CO2: 27 mEq/L (ref 19–32)
Calcium: 9.1 mg/dL (ref 8.4–10.5)
Chloride: 108 mEq/L (ref 96–112)
Creatinine, Ser: 0.9 mg/dL (ref 0.4–1.5)
GFR: 101.17 mL/min (ref >60.00)
Glucose, Bld: 96 mg/dL (ref 70–99)
POTASSIUM: 3.9 meq/L (ref 3.5–5.1)
SODIUM: 141 meq/L (ref 135–145)
TOTAL PROTEIN: 6.6 g/dL (ref 6.0–8.3)

## 2014-01-08 ENCOUNTER — Ambulatory Visit (INDEPENDENT_AMBULATORY_CARE_PROVIDER_SITE_OTHER): Payer: BC Managed Care – PPO | Admitting: Family Medicine

## 2014-01-08 ENCOUNTER — Encounter: Payer: Self-pay | Admitting: Family Medicine

## 2014-01-08 VITALS — BP 108/70 | HR 76 | Temp 98.3°F | Ht 70.0 in | Wt 166.0 lb

## 2014-01-08 DIAGNOSIS — J45909 Unspecified asthma, uncomplicated: Secondary | ICD-10-CM

## 2014-01-08 DIAGNOSIS — Z0000 Encounter for general adult medical examination without abnormal findings: Secondary | ICD-10-CM

## 2014-01-08 DIAGNOSIS — K219 Gastro-esophageal reflux disease without esophagitis: Secondary | ICD-10-CM

## 2014-01-08 DIAGNOSIS — J309 Allergic rhinitis, unspecified: Secondary | ICD-10-CM

## 2014-01-08 NOTE — Assessment & Plan Note (Signed)
Intermittent - on zyrtec and benadryl daily.

## 2014-01-08 NOTE — Assessment & Plan Note (Signed)
Preventative protocols reviewed and updated unless pt declined. Discussed healthy diet and lifestyle.  

## 2014-01-08 NOTE — Assessment & Plan Note (Signed)
Chronic, stable on QOD zegerid dosing.

## 2014-01-08 NOTE — Progress Notes (Signed)
BP 108/70  Pulse 76  Temp(Src) 98.3 F (36.8 C) (Oral)  Ht 5\' 10"  (1.778 m)  Wt 166 lb (75.297 kg)  BMI 23.82 kg/m2   CC: CPE  Subjective:    Patient ID: Bruce Griffin, male    DOB: 12/21/1970, 43 y.o.   MRN: [id number]  HPI: Bruce Griffin is a 43 y.o. male presenting on 01/08/2014 for Annual Exam   First time in 14 yrs where he's had to use albuterol inhaler TID-QID regularly (for last 1 week). Noticing increased need over last 3 weeks. Noticing worsening issue over last 3 weeks. We had latest decreased advair to 1 puff daily. Denies cough, wheezing.  Preventative: Flu shot - done at work.  Tetanus 2011.  Seat belt use discussed. Sunscreen use use discussed. No suspicious moles.   Mother with h/o melanoma (age 2s).  Father with rectal cancer dx 40yo. 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  Allergies and medications reviewed and updated. Current Outpatient Prescriptions on File Prior to Visit  Medication Sig  . albuterol (VENTOLIN HFA) 108 (90 BASE) MCG/ACT inhaler Inhale 2 puffs into the lungs every 6 (six) hours as needed for wheezing.  Marland Kitchen BIOTIN PO Take 3 tablets by mouth daily.  . cetirizine (ZYRTEC) 10 MG tablet Take 10 mg by mouth daily.   . diphenhydrAMINE (BENADRYL) 25 MG tablet Take 25 mg by mouth as needed.  . fluticasone (FLONASE) 50 MCG/ACT nasal spray Place 2 sprays into both nostrils daily.  . Fluticasone-Salmeterol (ADVAIR DISKUS) 100-50 MCG/DOSE AEPB Inhale 1 puff into the lungs every 12 (twelve) hours.  . Lysine 500 MG CAPS Take 1 capsule by mouth 2 (two) times daily.  . naproxen (NAPROSYN) 500 MG  tablet Take one po bid x 1 week then prn pain, take with food  . Omeprazole-Sodium Bicarbonate (ZEGERID) 20-1100 MG CAPS Take 1 capsule by mouth every 3 (three) days.    No current facility-administered medications on file prior to visit.    Review of Systems  Constitutional: Negative for fever, chills, activity change, appetite change, fatigue and unexpected weight change.  HENT: Negative for hearing loss.   Eyes: Negative for visual disturbance.  Respiratory: Positive for chest tightness and shortness of breath. Negative for cough and wheezing.        Due to humid heat recently  Cardiovascular: Negative for chest pain, palpitations and leg swelling.  Gastrointestinal: Negative for nausea, vomiting, abdominal pain, diarrhea, constipation, blood in stool and abdominal distention.  Genitourinary: Negative for hematuria and difficulty urinating.  Musculoskeletal: Negative for arthralgias, myalgias and neck pain.  Skin: Negative for rash.  Neurological: Negative for dizziness, seizures, syncope and headaches.  Hematological: Negative for adenopathy. Does not bruise/bleed easily.  Psychiatric/Behavioral: Negative for dysphoric mood. The patient is not nervous/anxious.    Per HPI unless specifically indicated above    Objective:    BP 108/70  Pulse 76  Temp(Src) 98.3 F (36.8 C) (Oral)  Ht 5\' 10"  (1.778 m)  Wt 166 lb (75.297 kg)  BMI 23.82 kg/m2  Physical Exam  Nursing note and vitals reviewed. Constitutional: He is oriented to person, place, and time. He appears well-developed and well-nourished. No  distress.  HENT:  Head: Normocephalic and atraumatic.  Right Ear: Hearing, tympanic membrane, external ear and ear canal normal.  Left Ear: Hearing, tympanic membrane, external ear and ear canal normal.  Nose: Nose normal.  Mouth/Throat: Uvula is midline, oropharynx is clear and moist and mucous membranes are normal. No oropharyngeal exudate, posterior oropharyngeal edema or posterior  oropharyngeal erythema.  Eyes: Conjunctivae and EOM are normal. Pupils are equal, round, and reactive to light. No scleral icterus.  Neck: Normal range of motion. Neck supple. No thyromegaly present.  Cardiovascular: Normal rate, regular rhythm, normal heart sounds and intact distal pulses.   No murmur heard. Pulses:      Radial pulses are 2+ on the right side, and 2+ on the left side.  Pulmonary/Chest: Effort normal and breath sounds normal. No respiratory distress. He has no wheezes. He has no rales.  Abdominal: Soft. Bowel sounds are normal. He exhibits no distension and no mass. There is no tenderness. There is no rebound and no guarding.  Musculoskeletal: Normal range of motion. He exhibits no edema.  Lymphadenopathy:    He has no cervical adenopathy.  Neurological: He is alert and oriented to person, place, and time.  CN grossly intact, station and gait intact  Skin: Skin is warm and dry. No rash noted.  Blackhead at site of prior L buttock I&D for infected cyst (11/2012)  Psychiatric: He has a normal mood and affect. His behavior is normal. Judgment and thought content normal.   Results for orders placed in visit on 01/03/14  COMPREHENSIVE METABOLIC PANEL      Result Value Ref Range   Sodium 141  135 - 145 mEq/L   Potassium 3.9  3.5 - 5.1 mEq/L   Chloride 108  96 - 112 mEq/L   CO2 27  19 - 32 mEq/L   Glucose, Bld 96  70 - 99 mg/dL   BUN 18  6 - 23 mg/dL   Creatinine, Ser 0.9  0.4 - 1.5 mg/dL   Total Bilirubin 0.5  0.2 - 1.2 mg/dL   Alkaline Phosphatase 33 (*) 39 - 117 U/L   AST 19  0 - 37 U/L   ALT 21  0 - 53 U/L   Total Protein 6.6  6.0 - 8.3 g/dL   Albumin 4.2  3.5 - 5.2 g/dL   Calcium 9.1  8.4 - 10.5 mg/dL   GFR 101.17  >60.00 mL/min      Assessment & Plan:   Problem List Items Addressed This Visit   Healthcare maintenance - Primary     Preventative protocols reviewed and updated unless pt declined. Discussed healthy diet and lifestyle.    GERD     Chronic,  stable on QOD zegerid dosing.    ASTHMA     Deteriorated with decreased advair dosing as well as with worsened hot / humid weather rec increase advair to bid, update me if not improved with this. Lungs CTAB today - no need for steroid course.    ALLERGIC RHINITIS     Intermittent - on zyrtec and benadryl daily.        Follow up plan: Return in about 1 year (around 01/09/2015), or as needed, for annual exam, prior fasting for blood work.

## 2014-01-08 NOTE — Patient Instructions (Addendum)
Increase advair to twice daily for next few weeks. Good to see you today, call us with questions. Return as needed or in 1 year for next physical.

## 2014-01-08 NOTE — Assessment & Plan Note (Signed)
Deteriorated with decreased advair dosing as well as with worsened hot / humid weather rec increase advair to bid, update me if not improved with this. Lungs CTAB today - no need for steroid course.

## 2014-01-08 NOTE — Progress Notes (Signed)
Pre visit review using our clinic review tool, if applicable. No additional management support is needed unless otherwise documented below in the visit note. 

## 2014-05-08 ENCOUNTER — Ambulatory Visit: Payer: Self-pay | Admitting: Emergency Medicine

## 2014-10-29 ENCOUNTER — Other Ambulatory Visit: Payer: Self-pay | Admitting: Family Medicine

## 2015-01-10 ENCOUNTER — Ambulatory Visit (INDEPENDENT_AMBULATORY_CARE_PROVIDER_SITE_OTHER): Payer: BLUE CROSS/BLUE SHIELD | Admitting: Primary Care

## 2015-01-10 ENCOUNTER — Encounter: Payer: Self-pay | Admitting: Primary Care

## 2015-01-10 ENCOUNTER — Other Ambulatory Visit (INDEPENDENT_AMBULATORY_CARE_PROVIDER_SITE_OTHER): Payer: BLUE CROSS/BLUE SHIELD

## 2015-01-10 ENCOUNTER — Other Ambulatory Visit: Payer: Self-pay | Admitting: Family Medicine

## 2015-01-10 VITALS — BP 108/70 | HR 63 | Temp 98.2°F | Ht 70.0 in | Wt 174.4 lb

## 2015-01-10 DIAGNOSIS — Z0000 Encounter for general adult medical examination without abnormal findings: Secondary | ICD-10-CM

## 2015-01-10 DIAGNOSIS — W57XXXA Bitten or stung by nonvenomous insect and other nonvenomous arthropods, initial encounter: Secondary | ICD-10-CM | POA: Diagnosis not present

## 2015-01-10 DIAGNOSIS — K219 Gastro-esophageal reflux disease without esophagitis: Secondary | ICD-10-CM | POA: Diagnosis not present

## 2015-01-10 DIAGNOSIS — T148 Other injury of unspecified body region: Secondary | ICD-10-CM

## 2015-01-10 LAB — LIPID PANEL
Cholesterol: 140 mg/dL (ref 0–200)
HDL: 39.2 mg/dL (ref >39.00)
LDL Cholesterol: 86 mg/dL (ref 0–99)
NonHDL: 100.8
Total CHOL/HDL Ratio: 4
Triglycerides: 74 mg/dL (ref 0.0–149.0)
VLDL: 14.8 mg/dL (ref 0.0–40.0)

## 2015-01-10 LAB — BASIC METABOLIC PANEL WITH GFR
BUN: 12 mg/dL (ref 6–23)
CO2: 29 meq/L (ref 19–32)
Calcium: 9.3 mg/dL (ref 8.4–10.5)
Chloride: 106 meq/L (ref 96–112)
Creatinine, Ser: 0.93 mg/dL (ref 0.40–1.50)
GFR: 93.24 mL/min (ref >60.00)
Glucose, Bld: 95 mg/dL (ref 70–99)
Potassium: 4.1 meq/L (ref 3.5–5.1)
Sodium: 139 meq/L (ref 135–145)

## 2015-01-10 LAB — VITAMIN B12: VITAMIN B 12: 634 pg/mL (ref 211–911)

## 2015-01-10 NOTE — Progress Notes (Signed)
Subjective:    Patient ID: Bruce Griffin, male    DOB: 05/31/1971, 44 y.o.   MRN: [id number]  HPI  Bruce Griffin is a 44 year old male who presents today with a chief complaint of tick bite. He noticed a tick on his left ankle about 2 weeks ago and removed it from the site of attahcment. He's noticed a rash to the site of his bite about 1 week ago which he believes has worsened. He reports itching and mild tenderness. Denies headaches, fevers, body aches, chills. He's tried using ice due to some swelling last night which helped to reduce. He reports the tick was small and flat and he was able to remove it entirely without difficulty.  Review of Systems  Constitutional: Negative for fever, chills and fatigue.  Respiratory: Negative for shortness of breath.   Cardiovascular: Negative for chest pain.  Musculoskeletal: Negative for myalgias.  Skin: Positive for rash.  Neurological: Negative for dizziness and headaches.       Past Medical History  Diagnosis Date  . Asthma     hospitalized with intubation at age 30  . GERD (gastroesophageal reflux disease)   . Allergic rhinitis     History   Social History  . Marital Status: Single    Spouse Name: N/A  . Number of Children: 1  . Years of Education: N/A   Occupational History  . Inventory Coordinator    Social History Main Topics  . Smoking status: Former Smoker    Quit date: 07/20/1988  . Smokeless tobacco: Former Systems developer  . Alcohol Use: No     Comment: stopped 07/20/2009  . Drug Use: No  . Sexual Activity: Not on file   Other Topics Concern  . 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 Wisdom tooth  extraction      Family History  Problem Relation Age of Onset  . Adopted: Yes  . Heart attack Maternal Grandfather     stents  . Coronary artery disease Maternal Grandfather   . Stroke Maternal Grandfather   . Melanoma Mother   . Alcohol abuse Mother   . Alcohol abuse Father   . Drug abuse Mother   . Drug abuse Father   . Cancer Father 71    rectal    No Known Allergies  Current Outpatient Prescriptions on File Prior to Visit  Medication Sig Dispense Refill  . ADVAIR DISKUS 100-50 MCG/DOSE AEPB INHALE 1 PUFF INTO THE LUNGS EVERY 12 HOURS -- RINSE MOUTH AFTER EACHUSE 60 each 1  . BIOTIN PO Take 3 tablets by mouth daily.    . cetirizine (ZYRTEC) 10 MG tablet Take 10 mg by mouth daily.     . fluticasone (FLONASE) 50 MCG/ACT nasal spray INHALE 2 SPRAYS INTO EACH NOSTRIL EVERY DAY. 16 g 1  . Lysine 500 MG CAPS Take 1 capsule by mouth 2 (two) times daily.    . naproxen (NAPROSYN) 500 MG tablet Take one po bid x 1 week then prn pain, take with food 60 tablet 0  . Omeprazole-Sodium Bicarbonate (ZEGERID) 20-1100 MG CAPS Take 1 capsule by mouth every 3 (three)  days.     Marland Kitchen VENTOLIN HFA 108 (90 BASE) MCG/ACT inhaler INHALE 2 PUFFS EVERY 6 HOURS AS NEEDED FOR WHEEZING 18 g 1  . diphenhydrAMINE (BENADRYL) 25 MG tablet Take 25 mg by mouth as needed.     No current facility-administered medications on file prior to visit.    BP 108/70 mmHg  Pulse 63  Temp(Src) 98.2 F (36.8 C) (Oral)  Ht 5\' 10"  (1.778 m)  Wt 174 lb 6.4 oz (79.107 kg)  BMI 25.02 kg/m2  SpO2 97%    Objective:   Physical Exam  Cardiovascular: Normal rate and regular rhythm.   Pulmonary/Chest: Effort normal and breath sounds normal.  Skin:  Rash noted to ankle which appears to be irritated. Does not represent lyme disease or RMSF. Poorly approximated edges, dry skin, about 3cm in circumference, no central clearing.          Assessment & Plan:  Tick bite:  Rash present to left ankle. Is not suggestive of RMSF  or lyme disease. No fevers, chills, myalgias, confusion. Hydrocortisone OTC for itching. Education provided regarding warning signs of RMSP and lyme disease. Follow up PRN.

## 2015-01-10 NOTE — Progress Notes (Signed)
Pre visit review using our clinic review tool, if applicable. No additional management support is needed unless otherwise documented below in the visit note. 

## 2015-01-10 NOTE — Patient Instructions (Signed)
You Chancellor apply Hydrocortisone cream to the rash for itching.  Keep an eye on the redness and notify me if it becomes larger, and/or if you develop symptoms of headaches, fevers, chills, body aches.  Wear bug spray with "deet" when out in the yard.  It was nice meeting you!  Tick Bite Information Ticks are insects that attach themselves to the skin and draw blood for food. There are various types of ticks. Common types include wood ticks and deer ticks. Most ticks live in shrubs and grassy areas. Ticks can climb onto your body when you make contact with leaves or grass where the tick is waiting. The most common places on the body for ticks to attach themselves are the scalp, neck, armpits, waist, and groin. Most tick bites are harmless, but sometimes ticks carry germs that cause diseases. These germs can be spread to a person during the tick's feeding process. The chance of a disease spreading through a tick bite depends on:   The type of tick.  Time of year.   How long the tick is attached.   Geographic location.  HOW CAN YOU PREVENT TICK BITES? Take these steps to help prevent tick bites when you are outdoors:  Wear protective clothing. Long sleeves and long pants are best.   Wear white clothes so you can see ticks more easily.  Tuck your pant legs into your socks.   If walking on a trail, stay in the middle of the trail to avoid brushing against bushes.  Avoid walking through areas with long grass.  Put insect repellent on all exposed skin and along boot tops, pant legs, and sleeve cuffs.   Check clothing, hair, and skin repeatedly and before going inside.   Brush off any ticks that are not attached.  Take a shower or bath as soon as possible after being outdoors.  WHAT IS THE PROPER WAY TO REMOVE A TICK? Ticks should be removed as soon as possible to help prevent diseases caused by tick bites. 1. If latex gloves are available, put them on before trying to remove a  tick.  2. Using fine-point tweezers, grasp the tick as close to the skin as possible. You Salvador also use curved forceps or a tick removal tool. Grasp the tick as close to its head as possible. Avoid grasping the tick on its body. 3. Pull gently with steady upward pressure until the tick lets go. Do not twist the tick or jerk it suddenly. This Svehla break off the tick's head or mouth parts. 4. Do not squeeze or crush the tick's body. This could force disease-carrying fluids from the tick into your body.  5. After the tick is removed, wash the bite area and your hands with soap and water or other disinfectant such as alcohol. 6. Apply a small amount of antiseptic cream or ointment to the bite site.  7. Wash and disinfect any instruments that were used.  Do not try to remove a tick by applying a hot match, petroleum jelly, or fingernail polish to the tick. These methods do not work and Kruzel increase the chances of disease being spread from the tick bite.  WHEN SHOULD YOU SEEK MEDICAL CARE? Contact your health care provider if you are unable to remove a tick from your skin or if a part of the tick breaks off and is stuck in the skin.  After a tick bite, you need to be aware of signs and symptoms that could be related to  diseases spread by ticks. Contact your health care provider if you develop any of the following in the days or weeks after the tick bite:  Unexplained fever.  Rash. A circular rash that appears days or weeks after the tick bite Wenrich indicate the possibility of Lyme disease. The rash Mapps resemble a target with a bull's-eye and Tartt occur at a different part of your body than the tick bite.  Redness and swelling in the area of the tick bite.   Tender, swollen lymph glands.   Diarrhea.   Weight loss.   Cough.   Fatigue.   Muscle, joint, or bone pain.   Abdominal pain.   Headache.   Lethargy or a change in your level of consciousness.  Difficulty walking or moving  your legs.   Numbness in the legs.   Paralysis.  Shortness of breath.   Confusion.   Repeated vomiting.  Document Released: 07/03/2000 Document Revised: 04/26/2013 Document Reviewed: 12/14/2012 [facility] Patient Information 2015 [place], [place]. This information is not intended to replace advice given to you by your health care provider. Make sure you discuss any questions you have with your health care provider.

## 2015-01-11 ENCOUNTER — Other Ambulatory Visit: Payer: BC Managed Care – PPO

## 2015-01-15 ENCOUNTER — Encounter: Payer: Self-pay | Admitting: Family Medicine

## 2015-01-15 ENCOUNTER — Encounter: Payer: BC Managed Care – PPO | Admitting: Family Medicine

## 2015-01-15 ENCOUNTER — Ambulatory Visit (INDEPENDENT_AMBULATORY_CARE_PROVIDER_SITE_OTHER): Payer: BLUE CROSS/BLUE SHIELD | Admitting: Family Medicine

## 2015-01-15 VITALS — BP 110/68 | HR 64 | Temp 98.0°F | Ht 70.0 in | Wt 173.5 lb

## 2015-01-15 DIAGNOSIS — S80862D Insect bite (nonvenomous), left lower leg, subsequent encounter: Secondary | ICD-10-CM

## 2015-01-15 DIAGNOSIS — K219 Gastro-esophageal reflux disease without esophagitis: Secondary | ICD-10-CM

## 2015-01-15 DIAGNOSIS — Z0000 Encounter for general adult medical examination without abnormal findings: Secondary | ICD-10-CM | POA: Diagnosis not present

## 2015-01-15 DIAGNOSIS — S80862A Insect bite (nonvenomous), left lower leg, initial encounter: Secondary | ICD-10-CM | POA: Insufficient documentation

## 2015-01-15 DIAGNOSIS — W57XXXA Bitten or stung by nonvenomous insect and other nonvenomous arthropods, initial encounter: Secondary | ICD-10-CM

## 2015-01-15 DIAGNOSIS — W57XXXD Bitten or stung by nonvenomous insect and other nonvenomous arthropods, subsequent encounter: Secondary | ICD-10-CM

## 2015-01-15 DIAGNOSIS — J4520 Mild intermittent asthma, uncomplicated: Secondary | ICD-10-CM

## 2015-01-15 MED ORDER — DOXYCYCLINE HYCLATE 100 MG PO CAPS: 100.0000 mg | ORAL_CAPSULE | Freq: Two times a day (BID) | ORAL | Status: DC

## 2015-01-15 NOTE — Assessment & Plan Note (Signed)
Continue Q3d zegerid.

## 2015-01-15 NOTE — Patient Instructions (Addendum)
Return as needed or in 1 year for next physical. Continue advair one puff daily. You are doing well today. Take doxycycline twice daily with food for 10 days.

## 2015-01-15 NOTE — Progress Notes (Signed)
Pre visit review using our clinic review tool, if applicable. No additional management support is needed unless otherwise documented below in the visit note. 

## 2015-01-15 NOTE — Progress Notes (Signed)
BP 110/68 mmHg  Pulse 64  Temp(Src) 98 F (36.7 C) (Oral)  Ht 5\' 10"  (1.778 m)  Wt 173 lb 8 oz (78.699 kg)  BMI 24.89 kg/m2   CC: CPE  Subjective:    Patient ID: Bruce Griffin, male    DOB: 03-10-1971, 44 y.o.   MRN: [id number]  HPI: Bruce Griffin is a 44 y.o. male presenting on 01/15/2015 for Annual Exam   Asthma - stable. Using advair 1 puff daily.  Tick bite several weeks ago left inner ankle. Surrounding rash. Treated with hydrocortisone. Increased fatigue noted. Occasional headache as well.   Preventative: Flu shot - done at work.  Tetanus 2011. Seat belt use discussed. Sunscreen use use discussed. No suspicious moles.   Mother with h/o melanoma (age 65s).  Father with rectal cancer dx 104yo. 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 Interim medical history since our last visit reviewed. Allergies and medications reviewed and updated. Current Outpatient Prescriptions on File Prior to Visit  Medication Sig  . ADVAIR DISKUS 100-50 MCG/DOSE AEPB INHALE 1 PUFF INTO THE LUNGS EVERY 12 HOURS -- RINSE MOUTH AFTER EACHUSE  . BIOTIN PO Take 3 tablets by mouth daily.  . cetirizine (ZYRTEC) 10 MG tablet Take 10 mg by mouth daily.   . diphenhydrAMINE (BENADRYL) 25 MG tablet Take 25 mg by mouth as needed.  . fluticasone (FLONASE) 50 MCG/ACT nasal spray INHALE 2 SPRAYS INTO EACH NOSTRIL EVERY DAY.  Marland Kitchen Lysine 500 MG CAPS Take 1 capsule by mouth 2 (two) times daily.  Earney Navy Bicarbonate (ZEGERID) 20-1100 MG CAPS Take 1 capsule by mouth every 3 (three) days.   . VENTOLIN HFA 108 (90 BASE)  MCG/ACT inhaler INHALE 2 PUFFS EVERY 6 HOURS AS NEEDED FOR WHEEZING   No current facility-administered medications on file prior to visit.    Review of Systems  Constitutional: Positive for fatigue. Negative for fever, chills, activity change, appetite change and unexpected weight change.  HENT: Negative for hearing loss.   Eyes: Negative for visual disturbance.  Respiratory: Negative for cough, chest tightness, shortness of breath and wheezing.   Cardiovascular: Negative for chest pain, palpitations and leg swelling.  Gastrointestinal: Negative for nausea, vomiting, abdominal pain, diarrhea, constipation, blood in stool and abdominal distention.  Genitourinary: Negative for hematuria and difficulty urinating.  Musculoskeletal: Negative for myalgias, arthralgias and neck pain.  Skin: Positive for rash (L inner ankle).  Neurological: Positive for headaches. Negative for dizziness, seizures and syncope.  Hematological: Negative for adenopathy. Does not bruise/bleed easily.  Psychiatric/Behavioral: Negative for dysphoric mood. The patient is not nervous/anxious.    Per HPI unless specifically indicated above     Objective:    BP 110/68 mmHg  Pulse 64  Temp(Src) 98 F (36.7 C) (Oral)  Ht 5\' 10"  (1.778 m)  Wt 173 lb 8 oz (78.699 kg)  BMI 24.89 kg/m2  Wt Readings from Last 3 Encounters:  01/15/15 173 lb 8 oz (78.699 kg)  01/10/15 174 lb 6.4 oz (79.107 kg)  01/08/14 166 lb (75.297 kg)    Physical Exam  Constitutional: He is oriented to person, place, and time. He appears well-developed and well-nourished. No  distress.  HENT:  Head: Normocephalic and atraumatic.  Right Ear: Hearing, tympanic membrane, external ear and ear canal normal.  Left Ear: Hearing, tympanic membrane, external ear and ear canal normal.  Nose: Nose normal.  Mouth/Throat: Uvula is midline, oropharynx is clear and moist and mucous membranes are normal. No oropharyngeal exudate, posterior oropharyngeal edema or  posterior oropharyngeal erythema.  Eyes: Conjunctivae and EOM are normal. Pupils are equal, round, and reactive to light. No scleral icterus.  Neck: Normal range of motion. Neck supple. No thyromegaly present.  Cardiovascular: Normal rate, regular rhythm, normal heart sounds and intact distal pulses.   No murmur heard. Pulses:      Radial pulses are 2+ on the right side, and 2+ on the left side.  Pulmonary/Chest: Effort normal and breath sounds normal. No respiratory distress. He has no wheezes. He has no rales.  Abdominal: Soft. Bowel sounds are normal. He exhibits no distension and no mass. There is no tenderness. There is no rebound and no guarding.  Musculoskeletal: Normal range of motion. He exhibits no edema.  Lymphadenopathy:    He has no cervical adenopathy.  Neurological: He is alert and oriented to person, place, and time.  CN grossly intact, station and gait intact  Skin: Skin is warm and dry. Rash noted.  Papular nonblanching rash around L inner ankle site of prior tick bite. Mildly pruritic superiorly  Psychiatric: He has a normal mood and affect. His behavior is normal. Judgment and thought content normal.  Nursing note and vitals reviewed.  Results for orders placed or performed in visit on 64/40/34  Basic metabolic panel  Result Value Ref Range   Sodium 139 135 - 145 mEq/L   Potassium 4.1 3.5 - 5.1 mEq/L   Chloride 106 96 - 112 mEq/L   CO2 29 19 - 32 mEq/L   Glucose, Bld 95 70 - 99 mg/dL   BUN 12 6 - 23 mg/dL   Creatinine, Ser 0.93 0.40 - 1.50 mg/dL   Calcium 9.3 8.4 - 10.5 mg/dL   GFR 93.24 >60.00 mL/min  Vitamin B12  Result Value Ref Range   Vitamin B-12 634 211 - 911 pg/mL  Lipid panel  Result Value Ref Range   Cholesterol 140 0 - 200 mg/dL   Triglycerides 74.0 0.0 - 149.0 mg/dL   HDL 39.20 >39.00 mg/dL   VLDL 14.8 0.0 - 40.0 mg/dL   LDL Cholesterol 86 0 - 99 mg/dL   Total CHOL/HDL Ratio 4    NonHDL 100.80       Assessment & Plan:   Problem List Items  Addressed This Visit    Asthma    Chronic, stable. Continue advair 1 puff daily.      GERD    Continue Q3d zegerid.      Healthcare maintenance - Primary    Preventative protocols reviewed and updated unless pt declined. Discussed healthy diet and lifestyle.       Tick bite of left lower leg    With subsequent rash, fatigue, and headache. Will cover for lyme disease with 10d doxy course. Pt agrees with plan.          Follow up plan: Return in about 1 year (around 01/15/2016), or as needed, for annual exam, prior fasting for blood work.

## 2015-01-15 NOTE — Assessment & Plan Note (Signed)
Preventative protocols reviewed and updated unless pt declined. Discussed healthy diet and lifestyle.  

## 2015-01-15 NOTE — Assessment & Plan Note (Signed)
Chronic, stable. Continue advair 1 puff daily.

## 2015-01-15 NOTE — Assessment & Plan Note (Addendum)
With subsequent rash, fatigue, and headache. Will cover for lyme disease with 10d doxy course. Pt agrees with plan.

## 2015-03-28 ENCOUNTER — Other Ambulatory Visit: Payer: Self-pay | Admitting: Family Medicine

## 2015-10-01 ENCOUNTER — Encounter: Payer: Self-pay | Admitting: Family Medicine

## 2015-10-01 ENCOUNTER — Ambulatory Visit (INDEPENDENT_AMBULATORY_CARE_PROVIDER_SITE_OTHER): Payer: BLUE CROSS/BLUE SHIELD | Admitting: Family Medicine

## 2015-10-01 VITALS — BP 118/78 | HR 90 | Temp 99.3°F | Ht 70.0 in | Wt 174.0 lb

## 2015-10-01 DIAGNOSIS — R059 Cough, unspecified: Secondary | ICD-10-CM

## 2015-10-01 DIAGNOSIS — B9789 Other viral agents as the cause of diseases classified elsewhere: Principal | ICD-10-CM

## 2015-10-01 DIAGNOSIS — R05 Cough: Secondary | ICD-10-CM

## 2015-10-01 DIAGNOSIS — J069 Acute upper respiratory infection, unspecified: Secondary | ICD-10-CM

## 2015-10-01 LAB — POC INFLUENZA A&B (BINAX/QUICKVUE)
INFLUENZA A, POC: NEGATIVE
INFLUENZA B, POC: NEGATIVE

## 2015-10-01 MED ORDER — BENZONATATE 200 MG PO CAPS: 200.0000 mg | ORAL_CAPSULE | Freq: Three times a day (TID) | ORAL | Status: DC | PRN

## 2015-10-01 MED ORDER — PREDNISONE 10 MG PO TABS: ORAL_TABLET | ORAL | Status: DC

## 2015-10-01 NOTE — Patient Instructions (Signed)
I think you have a viral upper respiratory infection with flare of asthma  Take the prednisone as directed  Tessalon will help cough You can also use mucinex DM over the counter for congestion and cough Rest and drink fluids   Tylenol or ibuprofen for fever and aches    Update if not starting to improve in a week or if worsening

## 2015-10-01 NOTE — Progress Notes (Signed)
Pre visit review using our clinic review tool, if applicable. No additional management support is needed unless otherwise documented below in the visit note. 
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
## 2015-10-03 NOTE — Assessment & Plan Note (Signed)
Flu swab negative I think you have a viral upper respiratory infection with flare of asthma  Take the prednisone as directed  Tessalon will help cough You can also use mucinex DM over the counter for congestion and cough Rest and drink fluids   Tylenol or ibuprofen for fever and aches    Update if not starting to improve in a week or if worsening

## 2016-01-12 ENCOUNTER — Other Ambulatory Visit: Payer: Self-pay | Admitting: Family Medicine

## 2016-01-12 DIAGNOSIS — K219 Gastro-esophageal reflux disease without esophagitis: Secondary | ICD-10-CM

## 2016-01-12 DIAGNOSIS — Z1159 Encounter for screening for other viral diseases: Secondary | ICD-10-CM

## 2016-01-13 ENCOUNTER — Other Ambulatory Visit (INDEPENDENT_AMBULATORY_CARE_PROVIDER_SITE_OTHER): Payer: BLUE CROSS/BLUE SHIELD

## 2016-01-13 DIAGNOSIS — K219 Gastro-esophageal reflux disease without esophagitis: Secondary | ICD-10-CM | POA: Diagnosis not present

## 2016-01-13 DIAGNOSIS — Z1159 Encounter for screening for other viral diseases: Secondary | ICD-10-CM | POA: Diagnosis not present

## 2016-01-13 LAB — BASIC METABOLIC PANEL
BUN: 9 mg/dL (ref 6–23)
CHLORIDE: 104 meq/L (ref 96–112)
CO2: 31 mEq/L (ref 19–32)
Calcium: 9.3 mg/dL (ref 8.4–10.5)
Creatinine, Ser: 0.93 mg/dL (ref 0.40–1.50)
GFR: 92.83 mL/min (ref >60.00)
Glucose, Bld: 108 mg/dL — ABNORMAL HIGH (ref 70–99)
POTASSIUM: 3.8 meq/L (ref 3.5–5.1)
SODIUM: 141 meq/L (ref 135–145)

## 2016-01-14 LAB — HEPATITIS C ANTIBODY: HCV AB: NEGATIVE

## 2016-01-17 ENCOUNTER — Encounter: Payer: Self-pay | Admitting: Gastroenterology

## 2016-01-17 ENCOUNTER — Ambulatory Visit (INDEPENDENT_AMBULATORY_CARE_PROVIDER_SITE_OTHER)
Admission: RE | Admit: 2016-01-17 | Discharge: 2016-01-17 | Disposition: A | Discharge status: Home / Self Care | Payer: BLUE CROSS/BLUE SHIELD | Source: Ambulatory Visit | Attending: Family Medicine | Admitting: Family Medicine

## 2016-01-17 ENCOUNTER — Encounter: Payer: Self-pay | Admitting: Family Medicine

## 2016-01-17 ENCOUNTER — Ambulatory Visit (INDEPENDENT_AMBULATORY_CARE_PROVIDER_SITE_OTHER): Payer: BLUE CROSS/BLUE SHIELD | Admitting: Family Medicine

## 2016-01-17 VITALS — BP 116/80 | HR 73 | Temp 98.0°F | Ht 70.5 in | Wt 175.8 lb

## 2016-01-17 DIAGNOSIS — J309 Allergic rhinitis, unspecified: Secondary | ICD-10-CM

## 2016-01-17 DIAGNOSIS — M25531 Pain in right wrist: Secondary | ICD-10-CM | POA: Diagnosis not present

## 2016-01-17 DIAGNOSIS — Z800 Family history of malignant neoplasm of digestive organs: Secondary | ICD-10-CM

## 2016-01-17 DIAGNOSIS — J4520 Mild intermittent asthma, uncomplicated: Secondary | ICD-10-CM

## 2016-01-17 DIAGNOSIS — Z1211 Encounter for screening for malignant neoplasm of colon: Secondary | ICD-10-CM

## 2016-01-17 DIAGNOSIS — Z0000 Encounter for general adult medical examination without abnormal findings: Secondary | ICD-10-CM

## 2016-01-17 MED ORDER — FLUTICASONE PROPIONATE 50 MCG/ACT NA SUSP: NASAL | Status: DC

## 2016-01-17 MED ORDER — ALBUTEROL SULFATE HFA 108 (90 BASE) MCG/ACT IN AERS: INHALATION_SPRAY | RESPIRATORY_TRACT | Status: DC

## 2016-01-17 MED ORDER — FLUTICASONE-SALMETEROL 100-50 MCG/DOSE IN AEPB: INHALATION_SPRAY | RESPIRATORY_TRACT | Status: DC

## 2016-01-17 NOTE — Progress Notes (Signed)
Pre visit review using our clinic review tool, if applicable. No additional management support is needed unless otherwise documented below in the visit note. 
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
## 2016-01-17 NOTE — Patient Instructions (Addendum)
Try glucosamine supplement for right wrist pain.  Xray of wrist today. We will refer you for colon cancer screening to GI.  You are doing well today, return as needed or in 1 year for next physical.  Health Maintenance, Male A healthy lifestyle and preventative care can promote health and wellness.  Maintain regular health, dental, and eye exams.  Eat a healthy diet. Foods like vegetables, fruits, whole grains, low-fat dairy products, and lean protein foods contain the nutrients you need and are low in calories. Decrease your intake of foods high in solid fats, added sugars, and salt. Get information about a proper diet from your health care provider, if necessary.  Regular physical exercise is one of the most important things you can do for your health. Most adults should get at least 150 minutes of moderate-intensity exercise (any activity that increases your heart rate and causes you to sweat) each week. In addition, most adults need muscle-strengthening exercises on 2 or more days a week.   Maintain a healthy weight. The body mass index (BMI) is a screening tool to identify possible weight problems. It provides an estimate of body fat based on height and weight. Your health care provider can find your BMI and can help you achieve or maintain a healthy weight. For males 20 years and older:  A BMI below 18.5 is considered underweight.  A BMI of 18.5 to 24.9 is normal.  A BMI of 25 to 29.9 is considered overweight.  A BMI of 30 and above is considered obese.  Maintain normal blood lipids and cholesterol by exercising and minimizing your intake of saturated fat. Eat a balanced diet with plenty of fruits and vegetables. Blood tests for lipids and cholesterol should begin at age 19 and be repeated every 5 years. If your lipid or cholesterol levels are high, you are over age 81, or you are at high risk for heart disease, you Headlee need your cholesterol levels checked more frequently.Ongoing high  lipid and cholesterol levels should be treated with medicines if diet and exercise are not working.  If you smoke, find out from your health care provider how to quit. If you do not use tobacco, do not start.  Lung cancer screening is recommended for adults aged 43-80 years who are at high risk for developing lung cancer because of a history of smoking. A yearly low-dose CT scan of the lungs is recommended for people who have at least a 30-pack-year history of smoking and are current smokers or have quit within the past 15 years. A pack year of smoking is smoking an average of 1 pack of cigarettes a day for 1 year (for example, a 30-pack-year history of smoking could mean smoking 1 pack a day for 30 years or 2 packs a day for 15 years). Yearly screening should continue until the smoker has stopped smoking for at least 15 years. Yearly screening should be stopped for people who develop a health problem that would prevent them from having lung cancer treatment.  If you choose to drink alcohol, do not have more than 2 drinks per day. One drink is considered to be 12 oz (360 mL) of beer, 5 oz (150 mL) of wine, or 1.5 oz (45 mL) of liquor.  Avoid the use of street drugs. Do not share needles with anyone. Ask for help if you need support or instructions about stopping the use of drugs.  High blood pressure causes heart disease and increases the risk of stroke.  High blood pressure is more likely to develop in:  People who have blood pressure in the end of the normal range (100-139/85-89 mm Hg).  People who are overweight or obese.  People who are African American.  If you are 59-32 years of age, have your blood pressure checked every 3-5 years. If you are 106 years of age or older, have your blood pressure checked every year. You should have your blood pressure measured twice--once when you are at a hospital or clinic, and once when you are not at a hospital or clinic. Record the average of the two  measurements. To check your blood pressure when you are not at a hospital or clinic, you can use:  An automated blood pressure machine at a pharmacy.  A home blood pressure monitor.  If you are 65-41 years old, ask your health care provider if you should take aspirin to prevent heart disease.  Diabetes screening involves taking a blood sample to check your fasting blood sugar level. This should be done once every 3 years after age 11 if you are at a normal weight and without risk factors for diabetes. Testing should be considered at a younger age or be carried out more frequently if you are overweight and have at least 1 risk factor for diabetes.  Colorectal cancer can be detected and often prevented. Most routine colorectal cancer screening begins at the age of 17 and continues through age 2. However, your health care provider [place]l recommend screening at an earlier age if you have risk factors for colon cancer. On a yearly basis, your health care provider [place] provide home test kits to check for hidden blood in the stool. A small camera at the end of a tube Josten be used to directly examine the colon (sigmoidoscopy or colonoscopy) to detect the earliest forms of colorectal cancer. Talk to your health care provider about this at age 42 when routine screening begins. A direct exam of the colon should be repeated every 5-10 years through age 8, unless early forms of precancerous polyps or small growths are found.  People who are at an increased risk for hepatitis B should be screened for this virus. You are considered at high risk for hepatitis B if:  You were born in a country where hepatitis B occurs often. Talk with your health care provider about which countries are considered high risk.  Your parents were born in a high-risk country and you have not received a shot to protect against hepatitis B (hepatitis B vaccine).  You have HIV or AIDS.  You use needles to inject street drugs.  You live  with, or have sex with, someone who has hepatitis B.  You are a man who has sex with other men (MSM).  You get hemodialysis treatment.  You take certain medicines for conditions like cancer, organ transplantation, and autoimmune conditions.  Hepatitis C blood testing is recommended for all people born from [birth year] through 1966 and any individual with known risk factors for hepatitis C.  Healthy men should no longer receive prostate-specific antigen (PSA) blood tests as part of routine cancer screening. Talk to your health care provider about prostate cancer screening.  Testicular cancer screening is not recommended for adolescents or adult males who have no symptoms. Screening includes self-exam, a health care provider exam, and other screening tests. Consult with your health care provider about any symptoms you have or any concerns you have about testicular cancer.  Practice safe sex. Use condoms  and avoid high-risk sexual practices to reduce the spread of sexually transmitted infections (STIs).  You should be screened for STIs, including gonorrhea and chlamydia if:  You are sexually active and are younger than 24 years.  You are older than 24 years, and your health care provider tells you that you are at risk for this type of infection.  Your sexual activity has changed since you were last screened, and you are at an increased risk for chlamydia or gonorrhea. Ask your health care provider if you are at risk.  If you are at risk of being infected with HIV, it is recommended that you take a prescription medicine daily to prevent HIV infection. This is called pre-exposure prophylaxis (PrEP). You are considered at risk if:  You are a man who has sex with other men (MSM).  You are a heterosexual man who is sexually active with multiple partners.  You take drugs by injection.  You are sexually active with a partner who has HIV.  Talk with your health care provider about whether you are at  high risk of being infected with HIV. If you choose to begin PrEP, you should first be tested for HIV. You should then be tested every 3 months for as long as you are taking PrEP.  Use sunscreen. Apply sunscreen liberally and repeatedly throughout the day. You should seek shade when your shadow is shorter than you. Protect yourself by wearing long sleeves, pants, a wide-brimmed hat, and sunglasses year round whenever you are outdoors.  Tell your health care provider of new moles or changes in moles, especially if there is a change in shape or color. Also, tell your health care provider if a mole is larger than the size of a pencil eraser.  A one-time screening for abdominal aortic aneurysm (AAA) and surgical repair of large AAAs by ultrasound is recommended for men aged 64-75 years who are current or former smokers.  Stay current with your vaccines (immunizations).   This information is not intended to replace advice given to you by your health care provider. Make sure you discuss any questions you have with your health care provider.   Document Released: 01/02/2008 Document Revised: 07/27/2014 Document Reviewed: 12/01/2010 Elsevier Interactive Patient Education [employer].

## 2016-01-17 NOTE — Assessment & Plan Note (Signed)
Stable, continue current regimen 

## 2016-01-17 NOTE — Assessment & Plan Note (Signed)
Will refer for colonoscopy

## 2016-01-17 NOTE — Assessment & Plan Note (Signed)
Chronic, stable. Continue current regimen. 

## 2016-01-17 NOTE — Assessment & Plan Note (Signed)
Preventative protocols reviewed and updated unless pt declined. Discussed healthy diet and lifestyle.  

## 2016-03-20 HISTORY — PX: COLONOSCOPY: SHX174

## 2016-03-24 ENCOUNTER — Encounter: Payer: Self-pay | Admitting: Gastroenterology

## 2016-03-24 ENCOUNTER — Ambulatory Visit (INDEPENDENT_AMBULATORY_CARE_PROVIDER_SITE_OTHER): Payer: BLUE CROSS/BLUE SHIELD | Admitting: Gastroenterology

## 2016-03-24 VITALS — BP 120/82 | HR 72 | Ht 70.0 in | Wt 182.6 lb

## 2016-03-24 DIAGNOSIS — Z800 Family history of malignant neoplasm of digestive organs: Secondary | ICD-10-CM

## 2016-03-24 MED ORDER — NA SULFATE-K SULFATE-MG SULF 17.5-3.13-1.6 GM/177ML PO SOLN: 1.0000 | Freq: Once | ORAL | 0 refills | Status: AC

## 2016-03-24 NOTE — Progress Notes (Signed)
[facility] Consult Note:  History: Bruce Griffin 03/24/2016  Referring physician: Ria Bush, MD  Reason for consult/chief complaint: Colon Cancer Screening (Family hx of rectal cancer (age 46), patient has no compliants)   Subjective  HPI:  This is a 45 year old man referred by primary care for a family history of colorectal cancer. Bruce Griffin's father was diagnosed with rectal cancer at age 69. As far as he knows, there are no additional family members with colon or rectal cancer. Bruce Griffin feels well, denies abdominal pain altered bowel habits or rectal bleeding.   ROS:  Review of Systems  He denies chest pain dyspnea or dysuria Past Medical History: Past Medical History:  Diagnosis Date  . Allergic rhinitis   . Asthma    hospitalized with intubation at age 45  . GERD (gastroesophageal reflux disease)      Past Surgical History: Past Surgical History:  Procedure Laterality Date  . WISDOM TOOTH EXTRACTION       Family History: Family History  Problem Relation Age of Onset  . Adopted: Yes  . Coronary artery disease Maternal Grandfather     MI with stents  . Stroke Maternal Grandfather   . Melanoma Mother   . Alcohol abuse Mother   . Drug abuse Mother   . Alcohol abuse Father   . Drug abuse Father   . Rectal cancer Father 110    Social History: Social History   Social History  . Marital status: Single    Spouse name: N/A  . Number of children: 1  . Years of education: N/A   Occupational History  . Inventory Coordinator    Social History Main Topics  . Smoking status: Former Smoker    Quit date: 07/20/1988  . Smokeless tobacco: Former Systems developer  . Alcohol use 1.2 oz/week    2 Cans of beer per week     Comment: stopped 07/20/2009  . Drug use: No  . Sexual activity: Not Asked   Other Topics Concern  . 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 albuterol (VENTOLIN HFA) 108 (90 Base) MCG/ACT inhaler USE 2 PUFFS EVERY 6 HOURS AS NEEDED FOR WHEEZING 18 g 11  . BIOTIN PO Take 3 tablets by mouth daily.    . cetirizine (ZYRTEC) 10 MG tablet Take 10 mg by mouth daily.     . diphenhydrAMINE (BENADRYL) 25 MG tablet Take 25 mg by mouth as needed.    . fluticasone (FLONASE) 50 MCG/ACT nasal spray INHALE 2 SPRAYS INTO EACH NOSTRIL EVERY DAY. 16 g 11  . Fluticasone-Salmeterol (ADVAIR DISKUS) 100-50 MCG/DOSE AEPB INHALE ONE (1) PUFF EVERY 12 HOURS AS DIRECTED. RINSE MOUTH AFTER EACH USE. 60 each 11  . Lysine 500 MG CAPS Take 1 capsule by mouth 2 (two) times daily.    . naproxen sodium (ANAPROX) 220 MG tablet Take 220-440 mg by mouth daily as needed.     Bruce Griffin Bicarbonate (ZEGERID) 20-1100 MG CAPS Take 1 capsule by mouth every 3 (three) days.     . Na Sulfate-K Sulfate-Mg Sulf 17.5-3.13-1.6 GM/180ML SOLN Take 1 kit by mouth once. [place]  mL 0  . Probiotic Product (PROBIOTIC DAILY PO) Take 1 capsule by mouth daily.     No current facility-administered medications for this visit.       ___________________________________________________________________ Objective   Exam:  BP 120/82 (BP Location: Left Arm, Patient Position: Sitting, Cuff Size: Normal)   Pulse 72   Ht _0  (1.778 m)   Wt 182 lb 9.6 oz (82.8 kg)   BMI 26.20 kg/m    General: this is a(n) Well-appearing 45 year old man   Eyes: sclera anicteric, no redness  ENT: oral mucosa moist without lesions, no cervical or supraclavicular lymphadenopathy, good dentition  CV: RRR without murmur, S1/S2, no JVD, no peripheral edema  Resp: clear to auscultation bilaterally,  normal RR and effort noted  GI: soft, no tenderness, with active bowel sounds. No guarding or palpable organomegaly noted.  Labs:  CMP Latest Ref Rng & Units 01/13/2016 01/10/2015 01/03/2014  Glucose 70 - 99 mg/dL 108(H) 95 96  BUN 6 - 23 mg/dL _1 Creatinine 0.40 - 1.50 mg/dL 0.93 0.93 0.9  Sodium 135 - 145 mEq/L 141 139 141  Potassium 3.5 - 5.1 mEq/L 3.8 4.1 3.9  Chloride 96 - 112 mEq/L 104 106 108  CO2 19 - 32 mEq/L _2 Calcium 8.4 - 10.5 mg/dL 9.3 9.3 9.1  Total Protein 6.0 - 8.3 g/dL - - 6.6  Total Bilirubin 0.2 - 1.2 mg/dL - - 0.5  Alkaline Phos 39 - 117 U/L - - 33(L)  AST 0 - 37 U/L - - 19  ALT 0 - 53 U/L - - 21     Assessment: Encounter Diagnosis  Name Primary?  . Family history of rectal cancer Yes     Plan:  Colonoscopy  The benefits and risks of the planned procedure were described in detail with the patient or (when appropriate) their health care proxy.  Risks were outlined as including, but not limited to, bleeding, infection, perforation, adverse medication reaction leading to cardiac or pulmonary decompensation, or pancreatitis (if ERCP).  The limitation of incomplete mucosal visualization was also discussed.  No guarantees or warranties were given.   Thank you for the courtesy of this consult.  Please call me with any questions or concerns.  Nelida Meuse III  CC: Ria Bush, MD

## 2016-03-24 NOTE — Patient Instructions (Signed)
You have been scheduled for a colonoscopy. Please follow written instructions given to you at your visit today.  Please pick up your prep supplies at the pharmacy within the next 1-3 days. If you use inhalers (even only as needed), please bring them with you on the day of your procedure. Your physician has requested that you go to www.startemmi.com and enter the access code given to you at your visit today. This web site gives a general overview about your procedure. However, you should still follow specific instructions given to you by our office regarding your preparation for the procedure.  If you are age 48 or older, your body mass index should be between 23-30. Your Body mass index is 26.2 kg/m. If this is out of the aforementioned range listed, please consider follow up with your Primary Care Provider.  If you are age 46 or younger, your body mass index should be between 19-25. Your Body mass index is 26.2 kg/m. If this is out of the aformentioned range listed, please consider follow up with your Primary Care Provider.   Thank you for choosing [facility]  Dr Wilfrid Lund III

## 2016-03-27 ENCOUNTER — Encounter: Payer: Self-pay | Admitting: Gastroenterology

## 2016-04-01 ENCOUNTER — Encounter: Payer: Self-pay | Admitting: Family Medicine

## 2016-04-01 ENCOUNTER — Ambulatory Visit (INDEPENDENT_AMBULATORY_CARE_PROVIDER_SITE_OTHER): Payer: BLUE CROSS/BLUE SHIELD | Admitting: Family Medicine

## 2016-04-01 VITALS — BP 102/70 | HR 67 | Temp 97.5°F | Wt 181.0 lb

## 2016-04-01 DIAGNOSIS — J4520 Mild intermittent asthma, uncomplicated: Secondary | ICD-10-CM | POA: Diagnosis not present

## 2016-04-01 DIAGNOSIS — J309 Allergic rhinitis, unspecified: Secondary | ICD-10-CM

## 2016-04-01 MED ORDER — PREDNISONE 20 MG PO TABS
ORAL_TABLET | ORAL | 0 refills | Status: DC
Start: 2016-04-01 — End: 2016-04-10

## 2016-04-01 MED ORDER — DOXYCYCLINE HYCLATE 100 MG PO CAPS
100.0000 mg | ORAL_CAPSULE | Freq: Two times a day (BID) | ORAL | 0 refills | Status: DC
Start: 2016-04-01 — End: 2016-10-21

## 2016-04-01 NOTE — Progress Notes (Signed)
Subjective:    Patient ID: Bruce Griffin, male    DOB: 07-15-1971, 45 y.o.   MRN: [id number]  HPI This is a 45 yo male who presents today with 2 days of itchy watery eyes, constant watery nasal drip. Takes zyrtec daily, takes benadryl prn, advair daily, flonase daily. No sinus pressure, or headaches. A lot of post nasal drainage. Chest starting to feel tight. No wheezing, no cough. Throat scratchy, no ear pain. Has used albuterol a few times. Has long standing history of asthma and allergic rhinitis. Usually controlled with prednisone burst, occasionally requires antibiotics. Last prednisone use 6 months ago, last antibiotic use over one year ago. Always tries to be proactive when symptoms start to prevent worsening of asthma symptoms.   Past Medical History:  Diagnosis Date  . Allergic rhinitis   . Asthma    hospitalized with intubation at age 57  . GERD (gastroesophageal reflux disease)    Past Surgical History:  Procedure Laterality Date  . WISDOM TOOTH EXTRACTION     Family History  Problem Relation Age of Onset  . Adopted: Yes  . Coronary artery disease Maternal Grandfather     MI with stents  . Stroke Maternal Grandfather   . Melanoma Mother   . Alcohol abuse Mother   . Drug abuse Mother   . Alcohol abuse Father   . Drug abuse Father   . Rectal cancer Father 1   Social History  Substance Use Topics  . Smoking status: Former Smoker    Quit date: 07/20/1988  . Smokeless tobacco: Former Systems developer  . Alcohol use 1.2 oz/week    2 Cans of beer per week     Comment: stopped 07/20/2009      Review of Systems Per HPI    Objective:   Physical Exam  Constitutional: He is oriented to person, place, and time. He appears well-developed and well-nourished. He appears ill (watery eyes, sounds congested, sniffling. ). No distress.  HENT:  Head: Normocephalic and atraumatic.  Right Ear: Tympanic membrane, external ear and ear canal normal.  Left Ear: Tympanic membrane, external  ear and ear canal normal.  Nose: Mucosal edema and rhinorrhea present. Right sinus exhibits no maxillary sinus tenderness and no frontal sinus tenderness. Left sinus exhibits no maxillary sinus tenderness and no frontal sinus tenderness.  Mouth/Throat: Oropharynx is clear and moist. No oropharyngeal exudate.  Cardiovascular: Normal rate, regular rhythm and normal heart sounds.   Pulmonary/Chest: Effort normal. He has wheezes (faint, expiratoty).  Neurological: He is alert and oriented to person, place, and time.  Skin: Skin is warm and dry. He is not diaphoretic.  Psychiatric: He has a normal mood and affect. His behavior is normal. Judgment and thought content normal.  Vitals reviewed.        BP 102/70   Pulse 67   Temp 97.5 F (36.4 C)   Wt 181 lb (82.1 kg)   SpO2 98%   BMI 25.97 kg/m  Wt Readings from Last 3 Encounters:  04/01/16 181 lb (82.1 kg)  03/24/16 182 lb 9.6 oz (82.8 kg)  01/17/16 175 lb 12 oz (79.7 kg)    Assessment & Plan:  1. Allergic rhinitis, unspecified allergic rhinitis type - continue zyrtec (can consider trying otc xyzal), flonase add antihistamine eye drops (has some at home), afrin BID for 3 days - predniSONE (DELTASONE) 20 MG tablet; Take two tablets for 3 days then one tablet for 4 days  Dispense: 10 tablet; Refill: 0  2.  Asthma, mild intermittent, uncomplicated - provided wait and see prescription for antibiotic if he develops s/s bacterial infection.  - instructed him to use his albuterol inhaler every 2-4 hours while awake for next 2 days.  - predniSONE (DELTASONE) 20 MG tablet; Take two tablets for 3 days then one tablet for 4 days  Dispense: 10 tablet; Refill: 0 - doxycycline (VIBRAMYCIN) 100 MG capsule; Take 1 capsule (100 mg total) by mouth 2 (two) times daily.  Dispense: 14 capsule; Refill: 0 - RTC precautions reviwed  Clarene Reamer, FNP-BC  [facility]  04/01/2016 9:33 AM

## 2016-04-01 NOTE — Patient Instructions (Signed)
Use albuterol inhaler every 2-4 hours while awake for the next couple of days Start prednisone today Add Afrin (generic is fine) nasal spray, two sprays in each nostril twice a day. Use before using flonase. Use for 3 days maximum.  Add antihistamine eye drops  If you start to have signs of infection- fever, colored sputum, can start antibiotic.

## 2016-04-10 ENCOUNTER — Encounter: Payer: Self-pay | Admitting: Gastroenterology

## 2016-04-10 ENCOUNTER — Ambulatory Visit (AMBULATORY_SURGERY_CENTER): Payer: BLUE CROSS/BLUE SHIELD | Admitting: Gastroenterology

## 2016-04-10 VITALS — BP 109/67 | HR 75 | Temp 98.9°F | Resp 10 | Ht 70.0 in | Wt 182.0 lb

## 2016-04-10 DIAGNOSIS — K635 Polyp of colon: Secondary | ICD-10-CM | POA: Diagnosis not present

## 2016-04-10 DIAGNOSIS — D126 Benign neoplasm of colon, unspecified: Secondary | ICD-10-CM | POA: Diagnosis not present

## 2016-04-10 DIAGNOSIS — Z800 Family history of malignant neoplasm of digestive organs: Secondary | ICD-10-CM

## 2016-04-10 DIAGNOSIS — Z1211 Encounter for screening for malignant neoplasm of colon: Secondary | ICD-10-CM | POA: Diagnosis not present

## 2016-04-10 DIAGNOSIS — D124 Benign neoplasm of descending colon: Secondary | ICD-10-CM

## 2016-04-10 DIAGNOSIS — D123 Benign neoplasm of transverse colon: Secondary | ICD-10-CM

## 2016-04-10 MED ORDER — SODIUM CHLORIDE 0.9 % IV SOLN: 500.0000 mL | INTRAVENOUS | Status: DC

## 2016-04-10 NOTE — Progress Notes (Signed)
Called to room to assist during endoscopic procedure.  Patient ID and intended procedure confirmed with present staff. Received instructions for my participation in the procedure from the performing physician.  

## 2016-04-10 NOTE — Patient Instructions (Signed)
YOU HAD AN ENDOSCOPIC PROCEDURE TODAY AT THE [facility]:   Refer to the procedure report that was given to you for any specific questions about what was found during the examination.  If the procedure report does not answer your questions, please call your gastroenterologist to clarify.  If you requested that your care partner not be given the details of your procedure findings, then the procedure report has been included in a sealed envelope for you to review at your convenience later.  YOU SHOULD EXPECT: Some feelings of bloating in the abdomen. Passage of more gas than usual.  Walking can help get rid of the air that was put into your GI tract during the procedure and reduce the bloating. If you had a lower endoscopy (such as a colonoscopy or flexible sigmoidoscopy) you [place]ki notice spotting of blood in your stool or on the toilet paper. If you underwent a bowel prep for your procedure, you Cull not have a normal bowel movement for a few days.  Please Note:  You might notice some irritation and congestion in your nose or some drainage.  This is from the oxygen used during your procedure.  There is no need for concern and it should clear up in a day or so.  SYMPTOMS TO REPORT IMMEDIATELY:   Following lower endoscopy (colonoscopy or flexible sigmoidoscopy):  Excessive amounts of blood in the stool  Significant tenderness or worsening of abdominal pains  Swelling of the abdomen that is new, acute  Fever of 100F or higher   Following upper endoscopy (EGD)  Vomiting of blood or coffee ground material  New chest pain or pain under the shoulder blades  Painful or persistently difficult swallowing  New shortness of breath  Fever of 100F or higher  Black, tarry-looking stools  For urgent or emergent issues, a gastroenterologist can be reached at any hour by calling [phone].   DIET:  We do recommend a small meal at first, but then you [place] proceed to your regular diet.  Drink  plenty of fluids but you should avoid alcoholic beverages for 24 hours.  ACTIVITY:  You should plan to take it easy for the rest of today and you should NOT DRIVE or use heavy machinery until tomorrow (because of the sedation medicines used during the test).    FOLLOW UP: Our staff will call the number listed on your records the next business day following your procedure to check on you and address any questions or concerns that you [place] have regarding the information given to you following your procedure. If we do not reach you, we will leave a message.  However, if you are feeling well and you are not experiencing any problems, there is no need to return our call.  We will assume that you have returned to your regular daily activities without incident.  If any biopsies were taken you will be contacted by phone or by letter within the next 1-3 weeks.  Please call us at [phone] if you have not heard about the biopsies in 3 weeks.    SIGNATURES/CONFIDENTIALITY: You and/or your care partner have signed paperwork which will be entered into your electronic medical record.  These signatures attest to the fact that that the information above on your After Visit Summary has been reviewed and is understood.  Full responsibility of the confidentiality of this discharge information lies with you and/or your care-partner.    Handout was given to your care partner on polyps.   You [place] resume your current medications today. Await biopsy results. Please call if any questions or concerns.   

## 2016-04-10 NOTE — Op Note (Addendum)
[name] Procedure Date: 04/10/2016 3:07 PM MRN: [id number] Endoscopist: Mallie Mussel L. Loletha Carrow , MD Age: 45 Referring MD:  Date of Birth: [birth year] Gender: Male Account #: [id number] Procedure:                Colonoscopy Indications:              Screening in patient at increased risk: rectal                            cancer in father before age 38, This is the                            patient's first colonoscopy Medicines:                Monitored Anesthesia Care Procedure:                Pre-Anesthesia Assessment:                           - Prior to the procedure, a History and Physical                            was performed, and patient medications and                            allergies were reviewed. The patient's tolerance of                            previous anesthesia was also reviewed. The risks                            and benefits of the procedure and the sedation                            options and risks were discussed with the patient.                            All questions were answered, and informed consent                            was obtained. Prior Anticoagulants: The patient has                            taken no previous anticoagulant or antiplatelet                            agents. ASA Grade Assessment: II - A patient with                            mild systemic disease. After reviewing the risks                            and benefits, the patient was deemed in  satisfactory condition to undergo the procedure.                           After obtaining informed consent, the colonoscope                            was passed under direct vision. Throughout the                            procedure, the patient's blood pressure, pulse, and                            oxygen saturations were monitored continuously. The                            Model CF-HQ190L [id number]) scope was  introduced                            through the anus and advanced to the the cecum,                            identified by appendiceal orifice and ileocecal                            valve. The colonoscopy was performed without                            difficulty. The patient tolerated the procedure                            well. The quality of the bowel preparation was                            excellent. The ileocecal valve, appendiceal                            orifice, and rectum were photographed. The bowel                            preparation used was SUPREP. Scope In: 3:30:14 PM Scope Out: 3:42:20 PM Scope Withdrawal Time: 0 hours 10 minutes 55 seconds  Total Procedure Duration: 0 hours 12 minutes 6 seconds  Findings:                 The perianal and digital rectal examinations were                            normal.                           Two sessile polyps were found in the proximal                            descending colon and proximal transverse colon. The  polyps were 4 mm in size. These polyps were removed                            with a cold snare. Resection and retrieval were                            complete.                           The exam was otherwise without abnormality on                            direct and retroflexion views. Complications:            No immediate complications. Estimated Blood Loss:     Estimated blood loss: none. Impression:               - Two 4 mm polyps in the proximal descending colon                            and in the proximal transverse colon, removed with                            a cold snare. Resected and retrieved.                           - The examination was otherwise normal on direct                            and retroflexion views. Recommendation:           - Patient has a contact number available for                            emergencies. The signs and symptoms of  potential                            delayed complications were discussed with the                            patient. Return to normal activities tomorrow.                            Written discharge instructions were provided to the                            patient.                           - Resume previous diet.                           - Continue present medications.                           - Await pathology results.                           -  Repeat colonoscopy in 5 years for surveillance. Henry L. Loletha Carrow, MD 04/10/2016 3:47:02 PM This report has been signed electronically.

## 2016-04-10 NOTE — Progress Notes (Signed)
A/ox3 pleased with MAC, report to Annette RN 

## 2016-04-10 NOTE — Progress Notes (Signed)
No problems noted in the recovery room. maw 

## 2016-04-13 ENCOUNTER — Telehealth: Payer: Self-pay

## 2016-04-13 NOTE — Telephone Encounter (Signed)
  Follow up Call-  Call back number 04/10/2016  Post procedure Call Back phone  # [phone]  Permission to leave phone message Yes  Some recent data might be hidden    Patient was called for follow up after his procedure on 04/10/2016. This is a second attempt to contact the patient. No answer at the number given for follow up phone call. A message was left on the answering machine.

## 2016-04-13 NOTE — Telephone Encounter (Signed)
  Follow up Call-  Call back number 04/10/2016  Post procedure Call Back phone  # [phone]  Permission to leave phone message Yes  Some recent data might be hidden     Patient does not have questions about his diet. Yes was checked by mistake.   Patient questions:  Do you have a fever, pain , or abdominal swelling? No. Pain Score  0 *  Have you tolerated food without any problems? Yes.    Have you been able to return to your normal activities? Yes.    Do you have any questions about your discharge instructions: Diet   Yes.   Medications  No. Follow up visit  No.  Do you have questions or concerns about your Care? No.  Actions: * If pain score is 4 or above: No action needed, pain <4.

## 2016-04-13 NOTE — Telephone Encounter (Signed)
Left a message at [phone] ext [phone] for the pt to call us back if any questions or concerns. maw

## 2016-04-15 ENCOUNTER — Encounter: Payer: Self-pay | Admitting: Family Medicine

## 2016-04-17 ENCOUNTER — Encounter: Payer: Self-pay | Admitting: Gastroenterology

## 2016-04-23 ENCOUNTER — Encounter: Payer: Self-pay | Admitting: Family Medicine

## 2016-10-21 ENCOUNTER — Encounter: Payer: Self-pay | Admitting: *Deleted

## 2016-10-21 ENCOUNTER — Ambulatory Visit (INDEPENDENT_AMBULATORY_CARE_PROVIDER_SITE_OTHER): Payer: BLUE CROSS/BLUE SHIELD | Admitting: Family Medicine

## 2016-10-21 ENCOUNTER — Encounter: Payer: Self-pay | Admitting: Family Medicine

## 2016-10-21 VITALS — BP 112/80 | HR 96 | Temp 99.2°F | Wt 176.5 lb

## 2016-10-21 DIAGNOSIS — J302 Other seasonal allergic rhinitis: Secondary | ICD-10-CM | POA: Diagnosis not present

## 2016-10-21 DIAGNOSIS — J4521 Mild intermittent asthma with (acute) exacerbation: Secondary | ICD-10-CM | POA: Diagnosis not present

## 2016-10-21 DIAGNOSIS — R21 Rash and other nonspecific skin eruption: Secondary | ICD-10-CM

## 2016-10-21 MED ORDER — AZITHROMYCIN 250 MG PO TABS: ORAL_TABLET | ORAL | 0 refills | Status: DC

## 2016-10-21 MED ORDER — PREDNISONE 20 MG PO TABS: ORAL_TABLET | ORAL | 0 refills | Status: DC

## 2016-10-21 NOTE — Patient Instructions (Addendum)
You have allergic rhinitis flare and asthma flare - treat with prednisone course.  If fever >101 or worsening symptoms, fill zpack antibiotic. Continue allergen avoidance measures.  Use lotrimin cream to groin rash - let us know if not improving.

## 2016-10-21 NOTE — Assessment & Plan Note (Addendum)
Chronic, treat current mild flare with steroid course. Continue daily advair and PRN albuterol. See below.  Low grade temperature today. No signs of bacterial infection today, however did provide with WASP for zpack in case fever or worsening productive cough develops.

## 2016-10-21 NOTE — Assessment & Plan Note (Signed)
Acute flare with weather change.  Treat with prednisone course.  Continue benadryl, flonase, zyrtec.  Discussed allergen avoidance.

## 2016-10-21 NOTE — Progress Notes (Signed)
Pre visit review using our clinic review tool, if applicable. No additional management support is needed unless otherwise documented below in the visit note. 

## 2016-10-21 NOTE — Assessment & Plan Note (Signed)
Anticipate jock itch - treat with OTC lotrimin BID x 1-2 wks. Update if persistent rash.

## 2016-10-21 NOTE — Progress Notes (Signed)
BP 112/80   Pulse 96   Temp 99.2 F (37.3 C) (Oral)   Wt 176 lb 8 oz (80.1 kg)   BMI 25.33 kg/m    CC: URI Subjective:    Patient ID: Bruce Griffin, male    DOB: [birth year], 46 y.o.   MRN: [id number]  HPI: Deigo Alonso Kirkendoll is a 46 y.o. male presenting on 10/21/2016 for URI (head congestion, drainage--x1 day)   1d h/o allergic rhinitis flare - congestion, sneezing, itchy watery eyes, rhinorrhea. Body aches. ST and PNdrainage. Feverish last night. Left work early.  Today coughing up phlegm. Some wheezing and chest tightness. Mild dyspnea.   No sick contacts at home. Non smoker.  Tried nyquil last night, using benadryl. Also regularly uses zyrtec and flonase.  No recent singulair use.   Asthma stable on advair with PRN albuterol use.   Also would like skin rash L groin evaluated. Itchy, scaly, present for several weeks.   Relevant past medical, surgical, family and social history reviewed and updated as indicated. Interim medical history since our last visit reviewed. Allergies and medications reviewed and updated. Outpatient Medications Prior to Visit  Medication Sig Dispense Refill  . albuterol (VENTOLIN HFA) 108 (90 Base) MCG/ACT inhaler USE 2 PUFFS EVERY 6 HOURS AS NEEDED FOR WHEEZING 18 g 11  . BIOTIN PO Take 3 tablets by mouth daily.    . cetirizine (ZYRTEC) 10 MG tablet Take 10 mg by mouth daily.     . diphenhydrAMINE (BENADRYL) 25 MG tablet Take 25 mg by mouth as needed.    . fluticasone (FLONASE) 50 MCG/ACT nasal spray INHALE 2 SPRAYS INTO EACH NOSTRIL EVERY DAY. 16 g 11  . Fluticasone-Salmeterol (ADVAIR DISKUS) 100-50 MCG/DOSE AEPB INHALE ONE (1) PUFF EVERY 12 HOURS AS DIRECTED. RINSE MOUTH AFTER EACH USE. 60 each 11  . naproxen sodium (ANAPROX) 220 MG tablet Take 220-440 mg by mouth daily as needed.     Earney Navy Bicarbonate (ZEGERID) 20-1100 MG CAPS Take 1 capsule by mouth every 3 (three) days.     Marland Kitchen Lysine 500 MG CAPS Take 1 capsule by mouth 2 (two)  times daily.    . Probiotic Product (PROBIOTIC DAILY PO) Take 1 capsule by mouth daily.    Marland Kitchen doxycycline (VIBRAMYCIN) 100 MG capsule Take 1 capsule (100 mg total) by mouth 2 (two) times daily. 14 capsule 0  . 0.9 %  sodium chloride infusion      No facility-administered medications prior to visit.      Per HPI unless specifically indicated in ROS section below Review of Systems     Objective:    BP 112/80   Pulse 96   Temp 99.2 F (37.3 C) (Oral)   Wt 176 lb 8 oz (80.1 kg)   BMI 25.33 kg/m   Wt Readings from Last 3 Encounters:  10/21/16 176 lb 8 oz (80.1 kg)  04/10/16 182 lb (82.6 kg)  04/01/16 181 lb (82.1 kg)    Physical Exam  Constitutional: He appears well-developed and well-nourished. No distress.  HENT:  Head: Normocephalic and atraumatic.  Right Ear: Hearing, tympanic membrane, external ear and ear canal normal.  Left Ear: Hearing, tympanic membrane, external ear and ear canal normal.  Nose: Mucosal edema and rhinorrhea present. Right sinus exhibits no maxillary sinus tenderness and no frontal sinus tenderness. Left sinus exhibits no maxillary sinus tenderness and no frontal sinus tenderness.  Mouth/Throat: Uvula is midline, oropharynx is clear and moist and mucous membranes are normal. No  oropharyngeal exudate, posterior oropharyngeal edema, posterior oropharyngeal erythema or tonsillar abscesses.  Fluid behind L TM Congested nasal mucosa  Eyes: Conjunctivae and EOM are normal. Pupils are equal, round, and reactive to light. No scleral icterus.  Neck: Normal range of motion. Neck supple.  Cardiovascular: Normal rate, regular rhythm, normal heart sounds and intact distal pulses.   No murmur heard. Pulmonary/Chest: Effort normal and breath sounds normal. No respiratory distress. He has no wheezes. He has no rales.  Lymphadenopathy:    He has no cervical adenopathy.  Skin: Skin is warm and dry. Rash noted.  Erythematous scaly rash L>R groin skin folds  Nursing note  and vitals reviewed.     Assessment & Plan:   Problem List Items Addressed This Visit    Allergic rhinitis - Primary    Acute flare with weather change.  Treat with prednisone course.  Continue benadryl, flonase, zyrtec.  Discussed allergen avoidance.       Mild intermittent asthma    Chronic, treat current mild flare with steroid course. Continue daily advair and PRN albuterol. See below.  Low grade temperature today. No signs of bacterial infection today, however did provide with WASP for zpack in case fever or worsening productive cough develops.       Relevant Medications   predniSONE (DELTASONE) 20 MG tablet   Skin rash    Anticipate jock itch - treat with OTC lotrimin BID x 1-2 wks. Update if persistent rash.           Follow up plan: Return if symptoms worsen or fail to improve.  Ria Bush, MD

## 2017-01-13 ENCOUNTER — Other Ambulatory Visit: Payer: Self-pay | Admitting: Family Medicine

## 2017-01-13 ENCOUNTER — Other Ambulatory Visit (INDEPENDENT_AMBULATORY_CARE_PROVIDER_SITE_OTHER): Payer: BLUE CROSS/BLUE SHIELD

## 2017-01-13 DIAGNOSIS — R739 Hyperglycemia, unspecified: Secondary | ICD-10-CM

## 2017-01-13 LAB — BASIC METABOLIC PANEL
BUN: 12 mg/dL (ref 6–23)
CHLORIDE: 105 meq/L (ref 96–112)
CO2: 32 meq/L (ref 19–32)
CREATININE: 0.91 mg/dL (ref 0.40–1.50)
Calcium: 9.2 mg/dL (ref 8.4–10.5)
GFR: 94.77 mL/min (ref >60.00)
Glucose, Bld: 125 mg/dL — ABNORMAL HIGH (ref 70–99)
Potassium: 4.1 mEq/L (ref 3.5–5.1)
Sodium: 142 mEq/L (ref 135–145)

## 2017-01-13 LAB — HEMOGLOBIN A1C: Hgb A1c MFr Bld: 5 % (ref 4.6–6.5)

## 2017-01-14 ENCOUNTER — Other Ambulatory Visit: Payer: BLUE CROSS/BLUE SHIELD

## 2017-01-18 ENCOUNTER — Ambulatory Visit (INDEPENDENT_AMBULATORY_CARE_PROVIDER_SITE_OTHER): Payer: BLUE CROSS/BLUE SHIELD | Admitting: Family Medicine

## 2017-01-18 ENCOUNTER — Encounter: Payer: Self-pay | Admitting: Family Medicine

## 2017-01-18 VITALS — BP 98/70 | HR 60 | Temp 97.6°F | Ht 70.5 in | Wt 177.0 lb

## 2017-01-18 DIAGNOSIS — Z0001 Encounter for general adult medical examination with abnormal findings: Secondary | ICD-10-CM

## 2017-01-18 DIAGNOSIS — J4520 Mild intermittent asthma, uncomplicated: Secondary | ICD-10-CM

## 2017-01-18 DIAGNOSIS — L989 Disorder of the skin and subcutaneous tissue, unspecified: Secondary | ICD-10-CM | POA: Diagnosis not present

## 2017-01-18 DIAGNOSIS — R739 Hyperglycemia, unspecified: Secondary | ICD-10-CM

## 2017-01-18 DIAGNOSIS — K219 Gastro-esophageal reflux disease without esophagitis: Secondary | ICD-10-CM

## 2017-01-18 DIAGNOSIS — M25531 Pain in right wrist: Secondary | ICD-10-CM

## 2017-01-18 DIAGNOSIS — Z0000 Encounter for general adult medical examination without abnormal findings: Secondary | ICD-10-CM

## 2017-01-18 MED ORDER — DICLOFENAC SODIUM 1 % TD GEL: 1.0000 "application " | Freq: Three times a day (TID) | TRANSDERMAL | 1 refills | Status: DC
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
## 2017-01-18 NOTE — Assessment & Plan Note (Signed)
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  Anticipate there is also component of chronic wrist tendonitis - recommended voltaren gel to wrist. Update with effect. Consider wrist brace.

## 2017-01-18 NOTE — Assessment & Plan Note (Signed)
Controlled with Q3d zegerid

## 2017-01-18 NOTE — Assessment & Plan Note (Signed)
Chronic, stable. Continue current regimen. 

## 2017-01-18 NOTE — Assessment & Plan Note (Signed)
Preventative protocols reviewed and updated unless pt declined. Discussed healthy diet and lifestyle.  

## 2017-01-18 NOTE — Patient Instructions (Addendum)
Try voltaren topical gel to R wrist.  You are doing well today. Return as needed or in 1 year for next physical.   Health Maintenance, Male A healthy lifestyle and preventive care is important for your health and wellness. Ask your health care provider about what schedule of regular examinations is right for you. What should I know about weight and diet? Eat a Healthy Diet  Eat plenty of vegetables, fruits, whole grains, low-fat dairy products, and lean protein.  Do not eat a lot of foods high in solid fats, added sugars, or salt.  Maintain a Healthy Weight Regular exercise can help you achieve or maintain a healthy weight. You should:  Do at least 150 minutes of exercise each week. The exercise should increase your heart rate and make you sweat (moderate-intensity exercise).  Do strength-training exercises at least twice a week.  Watch Your Levels of Cholesterol and Blood Lipids  Have your blood tested for lipids and cholesterol every 5 years starting at 47 years of age. If you are at high risk for heart disease, you should start having your blood tested when you are 48 years old. You [place] need to have your cholesterol levels checked more often if: ? Your lipid or cholesterol levels are high. ? You are older than 47 years of age. ? You are at high risk for heart disease.  What should I know about cancer screening? Many types of cancers can be detected early and [place] often be prevented. Lung Cancer  You should be screened every year for lung cancer if: ? You are a current smoker who has smoked for at least 30 years. ? You are a former smoker who has quit within the past 15 years.  Talk to your health care provider about your screening options, when you should start screening, and how often you should be screened.  Colorectal Cancer  Routine colorectal cancer screening usually begins at 47 years of age and should be repeated every 5-10 years until you are 47 years old. You [place]  need to be screened more often if early forms of precancerous polyps or small growths are found. Your health care provider Karpowicz recommend screening at an earlier age if you have risk factors for colon cancer.  Your health care provider [place] recommend using home test kits to check for hidden blood in the stool.  A small camera at the end of a tube can be used to examine your colon (sigmoidoscopy or colonoscopy). This checks for the earliest forms of colorectal cancer.  Prostate and Testicular Cancer  Depending on your age and overall health, your health care provider Pauli do certain tests to screen for prostate and testicular cancer.  Talk to your health care provider about any symptoms or concerns you have about testicular or prostate cancer.  Skin Cancer  Check your skin from head to toe regularly.  Tell your health care provider about any new moles or changes in moles, especially if: ? There is a change in a mole's size, shape, or color. ? You have a mole that is larger than a pencil eraser.  Always use sunscreen. Apply sunscreen liberally and repeat throughout the day.  Protect yourself by wearing long sleeves, pants, a wide-brimmed hat, and sunglasses when outside.  What should I know about heart disease, diabetes, and high blood pressure?  If you are 12-55 years of age, have your blood pressure checked every 3-5 years. If you are 1 years of age or older, have  your blood pressure checked every year. You should have your blood pressure measured twice-once when you are at a hospital or clinic, and once when you are not at a hospital or clinic. Record the average of the two measurements. To check your blood pressure when you are not at a hospital or clinic, you can use: ? An automated blood pressure machine at a pharmacy. ? A home blood pressure monitor.  Talk to your health care provider about your target blood pressure.  If you are between 53-29 years old, ask your health care  provider if you should take aspirin to prevent heart disease.  Have regular diabetes screenings by checking your fasting blood sugar level. ? If you are at a normal weight and have a low risk for diabetes, have this test once every three years after the age of 74. ? If you are overweight and have a high risk for diabetes, consider being tested at a younger age or more often.  A one-time screening for abdominal aortic aneurysm (AAA) by ultrasound is recommended for men aged 44-75 years who are current or former smokers. What should I know about preventing infection? Hepatitis B If you have a higher risk for hepatitis B, you should be screened for this virus. Talk with your health care provider to find out if you are at risk for hepatitis B infection. Hepatitis C Blood testing is recommended for:  Everyone born from [birth year] through 1966.  Anyone with known risk factors for hepatitis C.  Sexually Transmitted Diseases (STDs)  You should be screened each year for STDs including gonorrhea and chlamydia if: ? You are sexually active and are younger than 47 years of age. ? You are older than 47 years of age and your health care provider tells you that you are at risk for this type of infection. ? Your sexual activity has changed since you were last screened and you are at an increased risk for chlamydia or gonorrhea. Ask your health care provider if you are at risk.  Talk with your health care provider about whether you are at high risk of being infected with HIV. Your health care provider Logie recommend a prescription medicine to help prevent HIV infection.  What else can I do?  Schedule regular health, dental, and eye exams.  Stay current with your vaccines (immunizations).  Do not use any tobacco products, such as cigarettes, chewing tobacco, and e-cigarettes. If you need help quitting, ask your health care provider.  Limit alcohol intake to no more than 2 drinks per day. One drink equals 12  ounces of beer, 5 ounces of wine, or 1 ounces of hard liquor.  Do not use street drugs.  Do not share needles.  Ask your health care provider for help if you need support or information about quitting drugs.  Tell your health care provider if you often feel depressed.  Tell your health care provider if you have ever been abused or do not feel safe at home. This information is not intended to replace advice given to you by your health care provider. Make sure you discuss any questions you have with your health care provider. Document Released: 01/02/2008 Document Revised: 03/04/2016 Document Reviewed: 04/09/2015 Elsevier Interactive Patient Education  [employer].

## 2017-01-18 NOTE — Assessment & Plan Note (Signed)
Anticipate SK - rec cortisone 10 cream.

## 2017-01-18 NOTE — Assessment & Plan Note (Signed)
A1c normal

## 2017-01-28 ENCOUNTER — Telehealth: Payer: Self-pay | Admitting: *Deleted

## 2017-01-28 NOTE — Telephone Encounter (Signed)
Form received indicating diclofenac Rx denied. Per Dr Darnell Level, he [place] pay out of pocket if he would like. Lm on pts vm requesting a call back

## 2017-02-01 NOTE — Telephone Encounter (Signed)
Pt returned your call. Thanks  °

## 2017-02-01 NOTE — Telephone Encounter (Signed)
Spoke to pt and advised per Dr G 

## 2017-02-01 NOTE — Telephone Encounter (Signed)
Pt returned your call.  

## 2017-06-02 ENCOUNTER — Other Ambulatory Visit: Payer: Self-pay | Admitting: Family Medicine

## 2017-06-04 ENCOUNTER — Ambulatory Visit: Payer: BLUE CROSS/BLUE SHIELD | Admitting: Family Medicine

## 2017-06-04 ENCOUNTER — Encounter: Payer: Self-pay | Admitting: Family Medicine

## 2017-06-04 VITALS — BP 118/62 | HR 85 | Temp 98.3°F | Wt 172.0 lb

## 2017-06-04 DIAGNOSIS — J302 Other seasonal allergic rhinitis: Secondary | ICD-10-CM | POA: Diagnosis not present

## 2017-06-04 MED ORDER — OLOPATADINE HCL 0.1 % OP SOLN: 1.0000 [drp] | Freq: Two times a day (BID) | OPHTHALMIC | 3 refills | Status: DC

## 2017-06-04 MED ORDER — PREDNISONE 20 MG PO TABS: ORAL_TABLET | ORAL | 0 refills | Status: DC

## 2017-06-04 NOTE — Progress Notes (Signed)
BP 118/62 (BP Location: Left Arm, Patient Position: Sitting, Cuff Size: Normal)   Pulse 85   Temp 98.3 F (36.8 C) (Oral)   Wt 172 lb (78 kg)   SpO2 97%   BMI 24.33 kg/m    CC: nasal congestion Subjective:    Patient ID: Bruce Griffin, male    DOB: Blomquist 20, 1972, 46 y.o.   MRN: [id number]  HPI: Bruce Griffin is a 46 y.o. male presenting on 06/04/2017 for Nasal Congestion (Started last night. Watery, itchy eyes, postnasal drip irritating throat and fullness in ears.  Head feels tight. Trying to "catch it" before it goes to the chest. H/o asthma)   1d h/o head congestion, rhinorrhea, watery itchy eyes, sneezing, PNdrainage, ST. Chest feels tight.   No cough, no fevers/chills.   Known asthma and allergic rhinitis. Tends to get flare at this time of the year.  Treating with benadryl and alka seltzer cold and flu.  Using albuterol intermittently, zyrtec 10mg  daily, flonase and advair.  singulair didn't previously help.   Relevant past medical, surgical, family and social history reviewed and updated as indicated. Interim medical history since our last visit reviewed. Allergies and medications reviewed and updated. Outpatient Medications Prior to Visit  Medication Sig Dispense Refill  . albuterol (VENTOLIN HFA) 108 (90 Base) MCG/ACT inhaler INHALE 2 PUFFS INTO THE LUNGS EVERY 6 HOURS AS NEEDED FOR WHEEZING. 18 g 8  . BIOTIN PO Take 3 tablets by mouth daily.    . cetirizine (ZYRTEC) 10 MG tablet Take 10 mg by mouth daily.     . diclofenac sodium (VOLTAREN) 1 % GEL Apply 1 application topically 3 (three) times daily. 1 Tube 1  . diphenhydrAMINE (BENADRYL) 25 MG tablet Take 25 mg by mouth as needed.    . fluticasone (FLONASE) 50 MCG/ACT nasal spray INHALE 2 SPRAYS INTO EACH NOSTRIL EVERY DAY. 16 g 11  . Fluticasone-Salmeterol (ADVAIR DISKUS) 100-50 MCG/DOSE AEPB INHALE ONE (1) PUFF EVERY 12 HOURS AS DIRECTED. RINSE MOUTH AFTER EACH USE. 60 each 8  . Lysine 500 MG CAPS Take 1  capsule by mouth 2 (two) times daily.    . naproxen sodium (ANAPROX) 220 MG tablet Take 220-440 mg by mouth daily as needed.     Earney Navy Bicarbonate (ZEGERID) 20-1100 MG CAPS Take 1 capsule by mouth every 3 (three) days.     . Probiotic Product (PROBIOTIC DAILY PO) Take 1 capsule by mouth daily.     No facility-administered medications prior to visit.      Per HPI unless specifically indicated in ROS section below Review of Systems     Objective:    BP 118/62 (BP Location: Left Arm, Patient Position: Sitting, Cuff Size: Normal)   Pulse 85   Temp 98.3 F (36.8 C) (Oral)   Wt 172 lb (78 kg)   SpO2 97%   BMI 24.33 kg/m   Wt Readings from Last 3 Encounters:  06/04/17 172 lb (78 kg)  01/18/17 177 lb (80.3 kg)  10/21/16 176 lb 8 oz (80.1 kg)    Physical Exam  Constitutional: He appears well-developed and well-nourished. No distress.  HENT:  Head: Normocephalic and atraumatic.  Right Ear: Hearing, tympanic membrane, external ear and ear canal normal.  Left Ear: Hearing, tympanic membrane, external ear and ear canal normal.  Nose: Mucosal edema and rhinorrhea (marked) present. Right sinus exhibits no maxillary sinus tenderness and no frontal sinus tenderness. Left sinus exhibits no maxillary sinus tenderness and no frontal sinus tenderness.  Mouth/Throat: Uvula is midline, oropharynx is clear and moist and mucous membranes are normal. No oropharyngeal exudate, posterior oropharyngeal edema, posterior oropharyngeal erythema or tonsillar abscesses.  Eyes: EOM are normal. Pupils are equal, round, and reactive to light. Right conjunctiva is injected. Left conjunctiva is injected. No scleral icterus.  Neck: Normal range of motion. Neck supple.  Cardiovascular: Normal rate, regular rhythm, normal heart sounds and intact distal pulses.  No murmur heard. Pulmonary/Chest: Effort normal and breath sounds normal. No respiratory distress. He has no wheezes. He has no rales.  Lungs  clear  Lymphadenopathy:    He has no cervical adenopathy.  Skin: Skin is warm and dry. No rash noted.  Nursing note and vitals reviewed.      Assessment & Plan:   Problem List Items Addressed This Visit    Allergic rhinitis - Primary    Acute exacerbation of allergic rhinitis without evidence of acute infection or asthma flare. Treat with prednisone taper, patanol eye drops, continue home treatment as up to now. Update if not improving with treatment. Pt agrees with plan.           Follow up plan: Return if symptoms worsen or fail to improve.  Ria Bush, MD

## 2017-06-04 NOTE — Patient Instructions (Addendum)
You have allergy flare. Lungs sounding ok. Treat with prednisone taper sent to pharmacy. Kiker use lubricating eye drops, or antihistamine eye drops - example sent to pharmacy as well.  Push fluids and rest, continue other medicines including albuterol.

## 2017-06-04 NOTE — Assessment & Plan Note (Addendum)
Acute exacerbation of allergic rhinitis without evidence of acute infection or asthma flare. Treat with prednisone taper, patanol eye drops, continue home treatment as up to now. Update if not improving with treatment. Pt agrees with plan.

## 2017-09-23 IMAGING — DX DG WRIST COMPLETE 3+V*R*
4 series · 4 of 4 positions shown · non-contrast
Comparison: None.

CLINICAL DATA: Chronic right wrist pain for 1 year without known
injury.

EXAM:
RIGHT WRIST - COMPLETE 3+ VIEW

[wrist ap (1 of 2)]
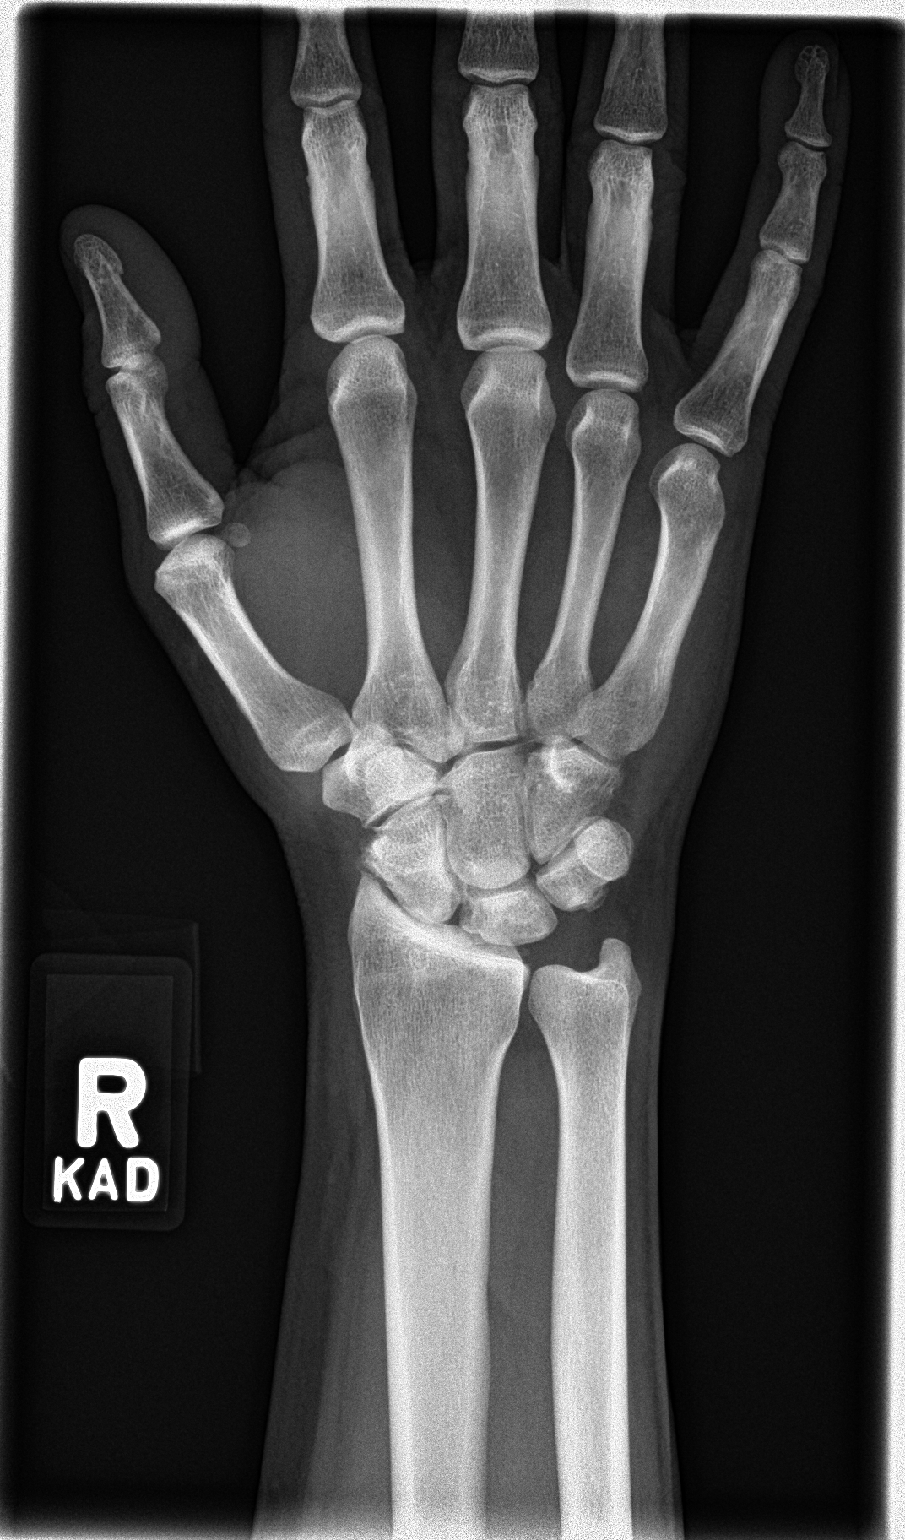

[wrist obl]
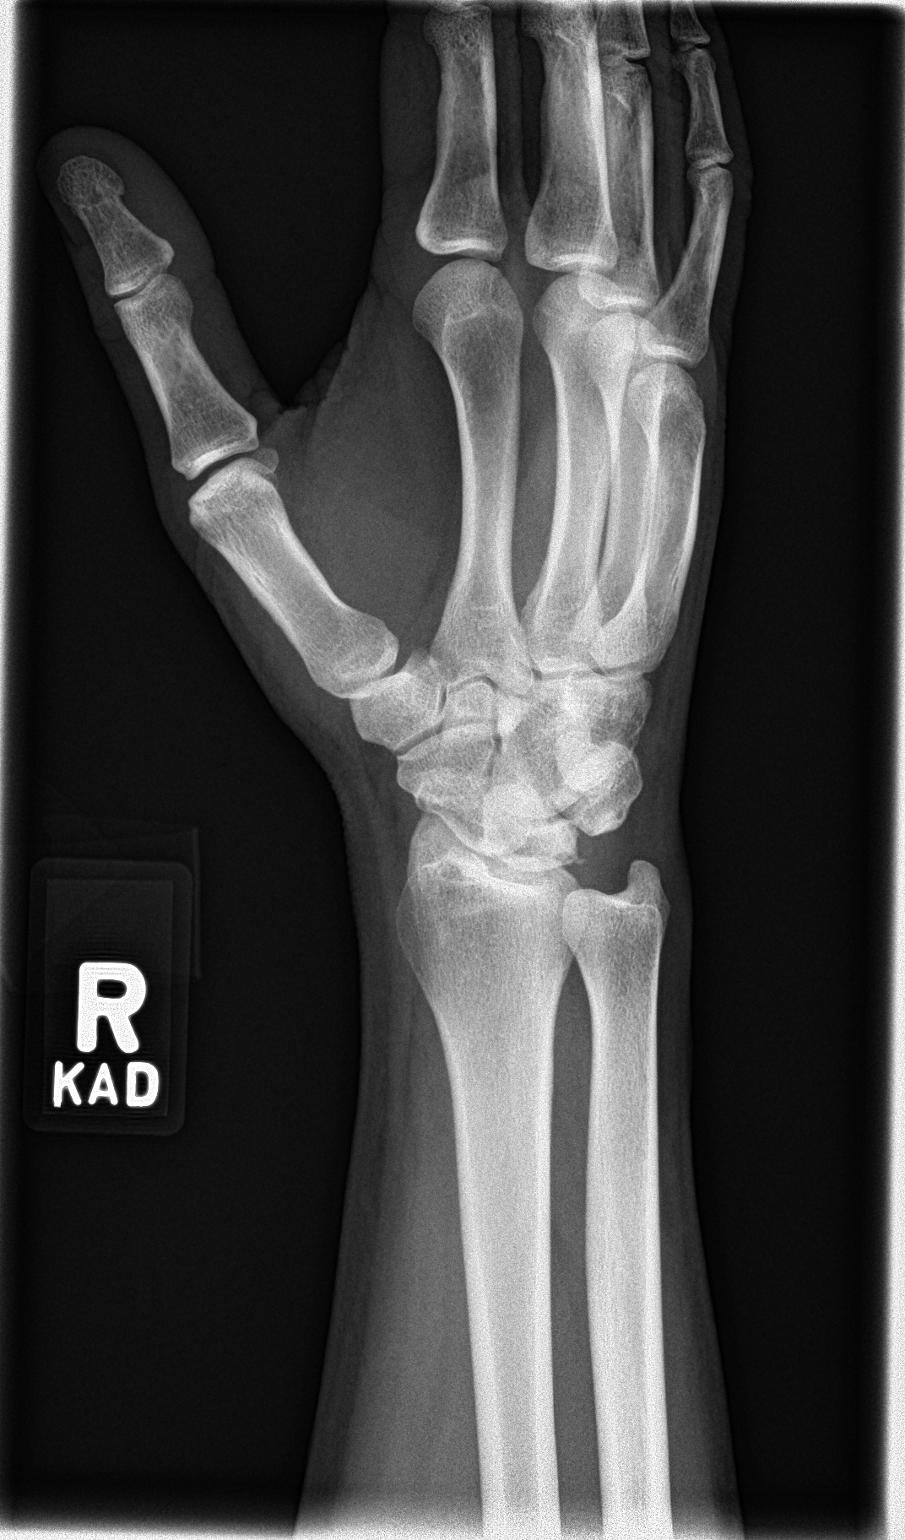

[wrist lat]
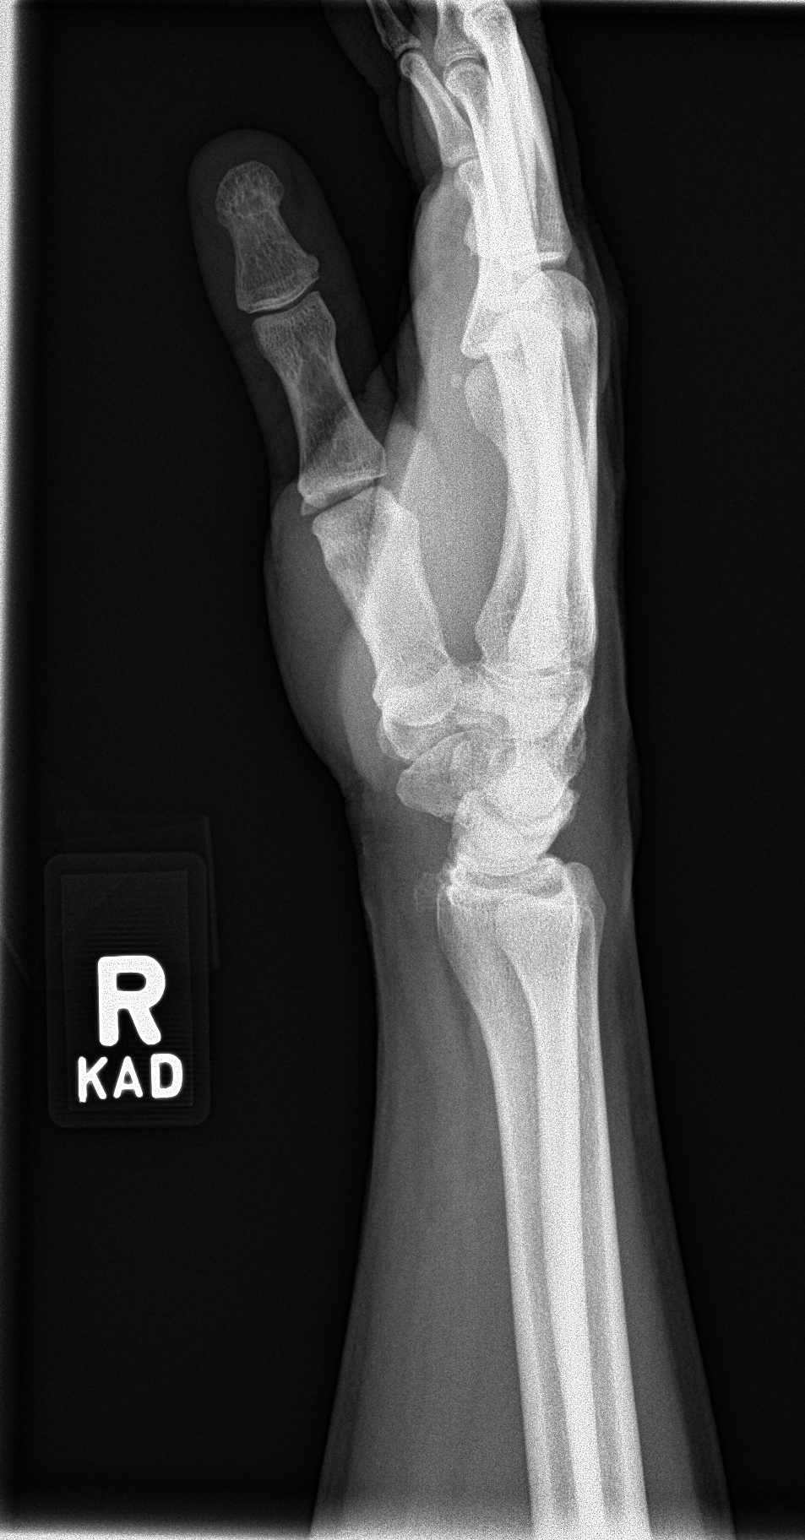

[wrist ap (2 of 2)]
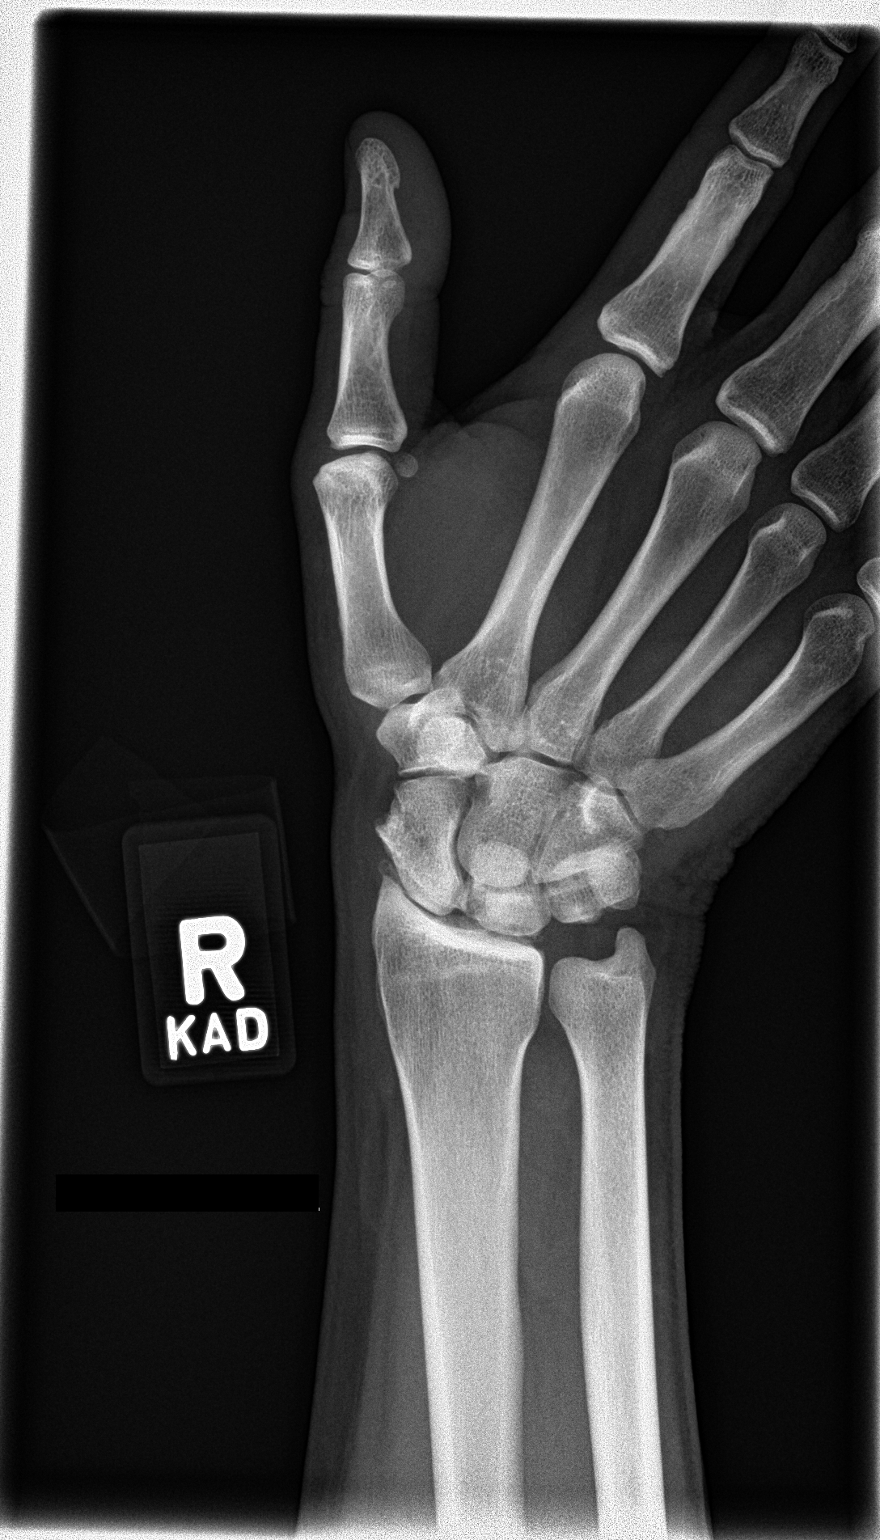

[4 of 4 positions shown; findings below may reference images not displayed]

FINDINGS: There is no evidence of fracture or dislocation. Mild narrowing of
radiocarpal joint is noted. Soft tissues are unremarkable.
IMPRESSION: Mild degenerative change seen involving radiocarpal joint. No acute
abnormality seen in the right wrist.

## 2017-12-14 ENCOUNTER — Telehealth: Payer: Self-pay | Admitting: Family Medicine

## 2017-12-14 MED ORDER — ADVAIR DISKUS 100-50 MCG/DOSE IN AEPB
INHALATION_SPRAY | RESPIRATORY_TRACT | 8 refills | Status: DC
Start: 2017-12-14 — End: 2018-10-26

## 2017-12-14 NOTE — Telephone Encounter (Signed)
We need to have reason for advair brand so we can fill out PA - why does he want brand? I have sent in brand advair refill but will likely need PA filled out

## 2017-12-14 NOTE — Telephone Encounter (Signed)
Spoke with pt explaining the issue with the Ventolin inhaler and that if he still wanted it, he would have to pay out of pocket.  Pt states he is ok with ProAir since that's what's covered.   Also, pt states he was first given brand name Advair Diskus, then was given generic rx for refill.  He is requesting rx for brand name for this med.

## 2017-12-14 NOTE — Telephone Encounter (Addendum)
When I spoke with pt during earlier call, he just stated he'd rather have brand names for meds when he can. No medical reason.

## 2017-12-14 NOTE — Telephone Encounter (Signed)
Spoke with [facility] asking about Ventolin rx.  Told pt's insurance no longer covers it but does cover ProAir.  Says they explained this to the pt but pt was insisting on Ventolin.

## 2017-12-14 NOTE — Telephone Encounter (Signed)
Best number [phone] Pt walked in stated [facility] get [facility] since [facility] and they have not heard from dr office.  Pt also stated they switched his advare to generic he wanted to know if this could be brand name only.  Please advise pt

## 2017-12-31 ENCOUNTER — Other Ambulatory Visit: Payer: Self-pay | Admitting: Family Medicine

## 2017-12-31 NOTE — Telephone Encounter (Signed)
Per pharmacy, pt's insurance no longer covers this med.  They suggest either DAW rx or Symbicort.

## 2018-01-03 MED ORDER — BUDESONIDE-FORMOTEROL FUMARATE 80-4.5 MCG/ACT IN AERO: 2.0000 | INHALATION_SPRAY | Freq: Two times a day (BID) | RESPIRATORY_TRACT | 6 refills | Status: DC

## 2018-01-03 NOTE — Telephone Encounter (Signed)
symbicort sent in.

## 2018-01-16 ENCOUNTER — Other Ambulatory Visit: Payer: Self-pay | Admitting: Family Medicine

## 2018-01-16 DIAGNOSIS — R739 Hyperglycemia, unspecified: Secondary | ICD-10-CM

## 2018-01-16 DIAGNOSIS — R748 Abnormal levels of other serum enzymes: Secondary | ICD-10-CM

## 2018-01-17 ENCOUNTER — Other Ambulatory Visit: Payer: Self-pay

## 2018-01-18 ENCOUNTER — Other Ambulatory Visit (INDEPENDENT_AMBULATORY_CARE_PROVIDER_SITE_OTHER): Payer: Managed Care, Other (non HMO)

## 2018-01-18 DIAGNOSIS — R739 Hyperglycemia, unspecified: Secondary | ICD-10-CM | POA: Diagnosis not present

## 2018-01-18 DIAGNOSIS — R748 Abnormal levels of other serum enzymes: Secondary | ICD-10-CM | POA: Diagnosis not present

## 2018-01-18 LAB — COMPREHENSIVE METABOLIC PANEL
ALT: 26 U/L (ref 0–53)
AST: 17 U/L (ref 0–37)
Albumin: 4.2 g/dL (ref 3.5–5.2)
Alkaline Phosphatase: 37 U/L — ABNORMAL LOW (ref 39–117)
BUN: 13 mg/dL (ref 6–23)
CALCIUM: 9 mg/dL (ref 8.4–10.5)
CHLORIDE: 103 meq/L (ref 96–112)
CO2: 32 mEq/L (ref 19–32)
Creatinine, Ser: 0.94 mg/dL (ref 0.40–1.50)
GFR: 90.9 mL/min (ref >60.00)
GLUCOSE: 105 mg/dL — AB (ref 70–99)
POTASSIUM: 3.9 meq/L (ref 3.5–5.1)
Sodium: 141 mEq/L (ref 135–145)
Total Bilirubin: 0.9 mg/dL (ref 0.2–1.2)
Total Protein: 6.7 g/dL (ref 6.0–8.3)

## 2018-01-21 ENCOUNTER — Encounter: Payer: Self-pay | Admitting: Family Medicine

## 2018-01-21 ENCOUNTER — Ambulatory Visit (INDEPENDENT_AMBULATORY_CARE_PROVIDER_SITE_OTHER): Payer: Managed Care, Other (non HMO) | Admitting: Family Medicine

## 2018-01-21 VITALS — BP 118/76 | HR 73 | Temp 98.5°F | Ht 70.5 in | Wt 166.2 lb

## 2018-01-21 DIAGNOSIS — R739 Hyperglycemia, unspecified: Secondary | ICD-10-CM

## 2018-01-21 DIAGNOSIS — J4520 Mild intermittent asthma, uncomplicated: Secondary | ICD-10-CM | POA: Diagnosis not present

## 2018-01-21 DIAGNOSIS — K219 Gastro-esophageal reflux disease without esophagitis: Secondary | ICD-10-CM

## 2018-01-21 DIAGNOSIS — Z0000 Encounter for general adult medical examination without abnormal findings: Secondary | ICD-10-CM

## 2018-01-21 MED ORDER — FLUTICASONE PROPIONATE 50 MCG/ACT NA SUSP: NASAL | 11 refills | Status: DC

## 2018-01-21 NOTE — Patient Instructions (Signed)
You are doing well. Continue current medicines. Return as needed or in 1 year for next physical.   Health Maintenance, Male A healthy lifestyle and preventive care is important for your health and wellness. Ask your health care provider about what schedule of regular examinations is right for you. What should I know about weight and diet? Eat a Healthy Diet  Eat plenty of vegetables, fruits, whole grains, low-fat dairy products, and lean protein.  Do not eat a lot of foods high in solid fats, added sugars, or salt.  Maintain a Healthy Weight Regular exercise can help you achieve or maintain a healthy weight. You should:  Do at least 150 minutes of exercise each week. The exercise should increase your heart rate and make you sweat (moderate-intensity exercise).  Do strength-training exercises at least twice a week.  Watch Your Levels of Cholesterol and Blood Lipids  Have your blood tested for lipids and cholesterol every 5 years starting at 48 years of age. If you are at high risk for heart disease, you should start having your blood tested when you are 48 years old. You Pytel need to have your cholesterol levels checked more often if: ? Your lipid or cholesterol levels are high. ? You are older than 48 years of age. ? You are at high risk for heart disease.  What should I know about cancer screening? Many types of cancers can be detected early and Contino often be prevented. Lung Cancer  You should be screened every year for lung cancer if: ? You are a current smoker who has smoked for at least 30 years. ? You are a former smoker who has quit within the past 15 years.  Talk to your health care provider about your screening options, when you should start screening, and how often you should be screened.  Colorectal Cancer  Routine colorectal cancer screening usually begins at 48 years of age and should be repeated every 5-10 years until you are 48 years old. You Fiorito need to be screened  more often if early forms of precancerous polyps or small growths are found. Your health care provider Latimore recommend screening at an earlier age if you have risk factors for colon cancer.  Your health care provider Woehrle recommend using home test kits to check for hidden blood in the stool.  A small camera at the end of a tube can be used to examine your colon (sigmoidoscopy or colonoscopy). This checks for the earliest forms of colorectal cancer.  Prostate and Testicular Cancer  Depending on your age and overall health, your health care provider Bess do certain tests to screen for prostate and testicular cancer.  Talk to your health care provider about any symptoms or concerns you have about testicular or prostate cancer.  Skin Cancer  Check your skin from head to toe regularly.  Tell your health care provider about any new moles or changes in moles, especially if: ? There is a change in a mole's size, shape, or color. ? You have a mole that is larger than a pencil eraser.  Always use sunscreen. Apply sunscreen liberally and repeat throughout the day.  Protect yourself by wearing long sleeves, pants, a wide-brimmed hat, and sunglasses when outside.  What should I know about heart disease, diabetes, and high blood pressure?  If you are 51-13 years of age, have your blood pressure checked every 3-5 years. If you are 27 years of age or older, have your blood pressure checked every year.  You should have your blood pressure measured twice-once when you are at a hospital or clinic, and once when you are not at a hospital or clinic. Record the average of the two measurements. To check your blood pressure when you are not at a hospital or clinic, you can use: ? An automated blood pressure machine at a pharmacy. ? A home blood pressure monitor.  Talk to your health care provider about your target blood pressure.  If you are between 79-24 years old, ask your health care provider if you should  take aspirin to prevent heart disease.  Have regular diabetes screenings by checking your fasting blood sugar level. ? If you are at a normal weight and have a low risk for diabetes, have this test once every three years after the age of 64. ? If you are overweight and have a high risk for diabetes, consider being tested at a younger age or more often.  A one-time screening for abdominal aortic aneurysm (AAA) by ultrasound is recommended for men aged 69-75 years who are current or former smokers. What should I know about preventing infection? Hepatitis B If you have a higher risk for hepatitis B, you should be screened for this virus. Talk with your health care provider to find out if you are at risk for hepatitis B infection. Hepatitis C Blood testing is recommended for:  Everyone born from [birth year] through 1966.  Anyone with known risk factors for hepatitis C.  Sexually Transmitted Diseases (STDs)  You should be screened each year for STDs including gonorrhea and chlamydia if: ? You are sexually active and are younger than 48 years of age. ? You are older than 48 years of age and your health care provider tells you that you are at risk for this type of infection. ? Your sexual activity has changed since you were last screened and you are at an increased risk for chlamydia or gonorrhea. Ask your health care provider if you are at risk.  Talk with your health care provider about whether you are at high risk of being infected with HIV. Your health care provider Eoff recommend a prescription medicine to help prevent HIV infection.  What else can I do?  Schedule regular health, dental, and eye exams.  Stay current with your vaccines (immunizations).  Do not use any tobacco products, such as cigarettes, chewing tobacco, and e-cigarettes. If you need help quitting, ask your health care provider.  Limit alcohol intake to no more than 2 drinks per day. One drink equals 12 ounces of beer, 5  ounces of wine, or 1 ounces of hard liquor.  Do not use street drugs.  Do not share needles.  Ask your health care provider for help if you need support or information about quitting drugs.  Tell your health care provider if you often feel depressed.  Tell your health care provider if you have ever been abused or do not feel safe at home. This information is not intended to replace advice given to you by your health care provider. Make sure you discuss any questions you have with your health care provider. Document Released: 01/02/2008 Document Revised: 03/04/2016 Document Reviewed: 04/09/2015 Elsevier Interactive Patient Education  [employer].

## 2018-01-21 NOTE — Assessment & Plan Note (Signed)
Controlled with intermittent zegerid use.

## 2018-01-21 NOTE — Assessment & Plan Note (Signed)
Preventative protocols reviewed and updated unless pt declined. Discussed healthy diet and lifestyle.  

## 2018-01-21 NOTE — Progress Notes (Signed)
BP 118/76 (BP Location: Left Arm, Patient Position: Sitting, Cuff Size: Normal)   Pulse 73   Temp 98.5 F (36.9 C) (Oral)   Ht 5' 10.5" (1.791 m)   Wt 166 lb 4 oz (75.4 kg)   SpO2 98%   BMI 23.52 kg/m    CC: CPE Subjective:    Patient ID: Bruce Griffin, male    DOB: 14-Oct-1970, 47 y.o.   MRN: [id number]  HPI: Bruce Griffin is a 47 y.o. male presenting on 01/21/2018 for Annual Exam   Asthma - treating with advair once daily. Rare albuterol use. No recent asthma flare. No night time awakenings. Asthma triggers are perfumes, exertion, pollen.   Some trouble with sleep - now better as he's in new relationship. Thinks sleeping issues were coming from prior relationship stress.  Preventative: COLONOSCOPY 03/2016 TA x2, rpt 5 yrs ([place]) - father with h/o rectal cancer age 30 Flu shot done at work  Tetanus 2011  Seat belt use discussed  Sunscreen use use discussed. No suspicious moles.  Ex smoker - quit 1990. GF smokes outside. 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 Interim medical history since our last visit reviewed. Allergies and medications reviewed and updated. Outpatient Medications Prior to Visit  Medication Sig Dispense Refill  . ADVAIR DISKUS 100-50 MCG/DOSE AEPB INHALE ONE (1) PUFF EVERY 12 HOURS AS DIRECTED. RINSE MOUTH AFTER EACH USE. 60 each 8  . albuterol (VENTOLIN HFA) 108 (90 Base) MCG/ACT inhaler INHALE 2 PUFFS INTO THE LUNGS EVERY 6 HOURS AS NEEDED FOR WHEEZING. 18 g 8  . BIOTIN PO Take 3 tablets by mouth daily.    . cetirizine (ZYRTEC) 10 MG tablet Take 10 mg  by mouth daily.     . diphenhydrAMINE (BENADRYL) 25 MG tablet Take 25 mg by mouth as needed.    Marland Kitchen Lysine 500 MG CAPS Take 1 capsule by mouth 2 (two) times daily. As needed    . naproxen sodium (ANAPROX) 220 MG tablet Take 220-440 mg by mouth daily as needed.     Earney Navy Bicarbonate (ZEGERID) 20-1100 MG CAPS Take 1 capsule by mouth every 3 (three) days.     . Probiotic Product (PROBIOTIC DAILY PO) Take 1 capsule by mouth daily.    . fluticasone (FLONASE) 50 MCG/ACT nasal spray INHALE 2 SPRAYS INTO EACH NOSTRIL EVERY DAY. 16 g 11  . budesonide-formoterol (SYMBICORT) 80-4.5 MCG/ACT inhaler Inhale 2 puffs into the lungs 2 (two) times daily. 1 Inhaler 6  . diclofenac sodium (VOLTAREN) 1 % GEL Apply 1 application topically 3 (three) times daily. 1 Tube 1  . olopatadine (PATANOL) 0.1 % ophthalmic solution Place 1 drop 2 (two) times daily into both eyes. 5 mL 3  . predniSONE (DELTASONE) 20 MG tablet Take two tablets daily for 3 days followed by one tablet daily for 4 days 10 tablet 0   No facility-administered medications prior to visit.      Per HPI unless specifically indicated in ROS section below Review of Systems  Constitutional: Negative for activity change, appetite change, chills, fatigue, fever and unexpected weight change.  HENT: Negative for hearing  loss.   Eyes: Negative for visual disturbance.  Respiratory: Negative for cough, chest tightness, shortness of breath and wheezing.   Cardiovascular: Negative for chest pain, palpitations and leg swelling.  Gastrointestinal: Negative for abdominal distention, abdominal pain, blood in stool, constipation, diarrhea, nausea and vomiting.  Genitourinary: Negative for difficulty urinating and hematuria.  Musculoskeletal: Negative for arthralgias, myalgias and neck pain.  Skin: Negative for rash.  Neurological: Negative for dizziness, seizures, syncope and headaches.  Hematological: Negative for adenopathy. Does not bruise/bleed  easily.  Psychiatric/Behavioral: Negative for dysphoric mood. The patient is not nervous/anxious.       Objective:    BP 118/76 (BP Location: Left Arm, Patient Position: Sitting, Cuff Size: Normal)   Pulse 73   Temp 98.5 F (36.9 C) (Oral)   Ht 5' 10.5" (1.791 m)   Wt 166 lb 4 oz (75.4 kg)   SpO2 98%   BMI 23.52 kg/m   Wt Readings from Last 3 Encounters:  01/21/18 166 lb 4 oz (75.4 kg)  06/04/17 172 lb (78 kg)  01/18/17 177 lb (80.3 kg)    Physical Exam  Constitutional: He is oriented to person, place, and time. He appears well-developed and well-nourished. No distress.  HENT:  Head: Normocephalic and atraumatic.  Right Ear: Hearing, tympanic membrane, external ear and ear canal normal.  Left Ear: Hearing, tympanic membrane, external ear and ear canal normal.  Nose: Nose normal.  Mouth/Throat: Uvula is midline, oropharynx is clear and moist and mucous membranes are normal. No oropharyngeal exudate, posterior oropharyngeal edema or posterior oropharyngeal erythema.  Eyes: Pupils are equal, round, and reactive to light. Conjunctivae and EOM are normal. No scleral icterus.  Neck: Normal range of motion. Neck supple.  Cardiovascular: Normal rate, regular rhythm, normal heart sounds and intact distal pulses.  No murmur heard. Pulses:      Radial pulses are 2+ on the right side, and 2+ on the left side.  Pulmonary/Chest: Effort normal and breath sounds normal. No respiratory distress. He has no wheezes. He has no rales.  Abdominal: Soft. Bowel sounds are normal. He exhibits no distension and no mass. There is no tenderness. There is no rebound and no guarding.  Musculoskeletal: Normal range of motion. He exhibits no edema.  Lymphadenopathy:    He has no cervical adenopathy.  Neurological: He is alert and oriented to person, place, and time.  CN grossly intact, station and gait intact  Skin: Skin is warm and dry. No rash noted.  Psychiatric: He has a normal mood and affect. His  behavior is normal. Judgment and thought content normal.  Nursing note and vitals reviewed.  Results for orders placed or performed in visit on 01/18/18  Comprehensive metabolic panel  Result Value Ref Range   Sodium 141 135 - 145 mEq/L   Potassium 3.9 3.5 - 5.1 mEq/L   Chloride 103 96 - 112 mEq/L   CO2 32 19 - 32 mEq/L   Glucose, Bld 105 (H) 70 - 99 mg/dL   BUN 13 6 - 23 mg/dL   Creatinine, Ser 0.94 0.40 - 1.50 mg/dL   Total Bilirubin 0.9 0.2 - 1.2 mg/dL   Alkaline Phosphatase 37 (L) 39 - 117 U/L   AST 17 0 - 37 U/L   ALT 26 0 - 53 U/L   Total Protein 6.7 6.0 - 8.3 g/dL   Albumin 4.2 3.5 - 5.2 g/dL   Calcium 9.0 8.4 - 10.5 mg/dL   GFR 90.90 >60.00 mL/min   Lab Results  Component Value Date   HGBA1C 5.0 01/13/2017       Assessment & Plan:   Problem List Items Addressed This Visit    Mild intermittent asthma    Chronic, stable on daily advair with rare need for albuterol.      Hyperglycemia    Discussed with patient normal a1c last year.       Healthcare maintenance - Primary    Preventative protocols reviewed and updated unless pt declined. Discussed healthy diet and lifestyle.       GERD    Controlled with intermittent zegerid use.           Meds ordered this encounter  Medications  . fluticasone (FLONASE) 50 MCG/ACT nasal spray    Sig: INHALE 2 SPRAYS INTO EACH NOSTRIL EVERY DAY.    Dispense:  16 g    Refill:  11   No orders of the defined types were placed in this encounter.   Follow up plan: Return in about 1 year (around 01/22/2019) for annual exam, prior fasting for blood work.  Ria Bush, MD

## 2018-01-21 NOTE — Assessment & Plan Note (Signed)
Discussed with patient normal a1c last year.

## 2018-01-21 NOTE — Assessment & Plan Note (Signed)
Chronic, stable on daily advair with rare need for albuterol.

## 2018-02-02 ENCOUNTER — Ambulatory Visit: Payer: Managed Care, Other (non HMO) | Admitting: Family Medicine

## 2018-02-02 ENCOUNTER — Encounter: Payer: Self-pay | Admitting: Family Medicine

## 2018-02-02 DIAGNOSIS — K4090 Unilateral inguinal hernia, without obstruction or gangrene, not specified as recurrent: Secondary | ICD-10-CM

## 2018-02-02 NOTE — Patient Instructions (Signed)
It looks like you have a small umbilical hernia and small left inguinal hernia.   When possible, it would be reasonable to talk to general surgery.   Call us if you need a referral.  Try to limit heavy lifting, especially if that is causing pain.   Take care.  Glad to see you.

## 2018-02-02 NOTE — Progress Notes (Signed)
His father is chronically ill and his mother was recently diagnosed with breast cancer.  Condolences offered.  He noted lump in L groin while in the shower recently.  No R sided sx.  No pain.  Not ttp.  It is slightly mobile.  No FCNAVD.  No B/B sx other some mild slowing of stream at baseline- this isn't an acute change.    He has h/o umbilical hernia at baseline, present for years.    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    Meds, vitals, and allergies reviewed.   ROS: Per HPI unless specifically indicated in ROS section   nad ncat rrr ctab abd soft, not ttp with small umbilical hernia and small left inguinal hernia.  Both easily reduced and soft.

## 2018-02-07 DIAGNOSIS — K4090 Unilateral inguinal hernia, without obstruction or gangrene, not specified as recurrent: Secondary | ICD-10-CM | POA: Insufficient documentation

## 2018-02-07 NOTE — Assessment & Plan Note (Signed)
Soft small umbilical hernia and small left inguinal hernia.   When possible, it would be reasonable to talk to general surgery.   He can let us know if he needs a referral. Try to limit heavy lifting, especially if that is causing pain.   Anatomy and routine cautions discussed with patient.  We had an extensive conversation about his home situation, helping care for his mother and father. >25 minutes spent in face to face time with patient, >50% spent in counselling or coordination of care.

## 2018-06-23 ENCOUNTER — Other Ambulatory Visit: Payer: Self-pay | Admitting: Family Medicine

## 2018-10-03 ENCOUNTER — Telehealth: Payer: Self-pay | Admitting: *Deleted

## 2018-10-03 NOTE — Telephone Encounter (Signed)
Patient left a voicemail stating that he is on several inhalers and he thinks that he has thrush. Patient stated that his tongue is raw and wants to know if you will call something in for him? [facility]

## 2018-10-04 MED ORDER — NYSTATIN 100000 UNIT/ML MT SUSP: 5.0000 mL | Freq: Four times a day (QID) | OROMUCOSAL | 0 refills | Status: AC

## 2018-10-04 NOTE — Telephone Encounter (Signed)
Left message on vm for pt to call back.  Need to relay Dr. G's message.  

## 2018-10-04 NOTE — Telephone Encounter (Signed)
Will send in nystatin swish/swallow. Let us know if not improved with this. Remind to time advair use with brushing teeth afterwards.

## 2018-10-05 NOTE — Telephone Encounter (Signed)
Left message on vm for pt to call back.  Need to relay Dr. G's message.  

## 2018-10-05 NOTE — Telephone Encounter (Signed)
Noted  

## 2018-10-05 NOTE — Telephone Encounter (Signed)
Patient returned call.  I relayed Dr.G's message and he had already picked up the rx.

## 2018-10-22 ENCOUNTER — Other Ambulatory Visit: Payer: Self-pay | Admitting: Family Medicine

## 2018-12-29 ENCOUNTER — Encounter: Payer: Self-pay | Admitting: Family Medicine

## 2018-12-29 ENCOUNTER — Other Ambulatory Visit: Payer: Managed Care, Other (non HMO)

## 2018-12-29 ENCOUNTER — Ambulatory Visit (INDEPENDENT_AMBULATORY_CARE_PROVIDER_SITE_OTHER): Payer: Managed Care, Other (non HMO) | Admitting: Family Medicine

## 2018-12-29 ENCOUNTER — Telehealth: Payer: Self-pay

## 2018-12-29 ENCOUNTER — Telehealth: Payer: Self-pay | Admitting: General Practice

## 2018-12-29 VITALS — Temp 98.7°F | Wt 167.6 lb

## 2018-12-29 DIAGNOSIS — Z20822 Contact with and (suspected) exposure to covid-19: Secondary | ICD-10-CM

## 2018-12-29 DIAGNOSIS — Z20828 Contact with and (suspected) exposure to other viral communicable diseases: Secondary | ICD-10-CM | POA: Diagnosis not present

## 2018-12-29 DIAGNOSIS — J029 Acute pharyngitis, unspecified: Secondary | ICD-10-CM

## 2018-12-29 NOTE — Progress Notes (Signed)
Virtual visit completed through [employer]. Due to national recommendations of social distancing due to COVID-19, a virtual visit is felt to be most appropriate for this patient at this time. Reviewed limitations of a virtual visit.   Patient location: home Provider location: [place] [facility], office If any vitals were documented, they were collected by patient at home unless specified below.    Temp 98.7 F (37.1 C)   Wt 167 lb 9.6 oz (76 kg)   BMI 23.71 kg/m    CC: ST Subjective:    Patient ID: Bruce Griffin, male    DOB: 08-02-1970, 48 y.o.   MRN: [id number]  HPI: Bruce Griffin is a 48 y.o. male presenting on 12/29/2018 for Sore Throat    Recent work exposure - coworker at his office tested positive for [place], he was out yesterday. Last worked with him on Tuesday. Does not use masks at work. Coworker was asymptomatic while at work.   Overall patient feels well, does have ST that started over weekend.  No fevers/chills, cough, dyspnea, loss of taste or smell, body aches, diarrhea.   Known asthma well controlled on wixela and flonase, zyrtec and albuterol PRN. Uses albuterol every morning.       Relevant past medical, surgical, family and social history reviewed and updated as indicated. Interim medical history since our last visit reviewed. Allergies and medications reviewed and updated. Outpatient Medications Prior to Visit  Medication Sig Dispense Refill  . albuterol (PROVENTIL HFA;VENTOLIN HFA) 108 (90 Base) MCG/ACT inhaler INHALE 2 PUFFS INTO THE LUNGS EVERY 6 HOURS AS NEEDED FOR WHEEZING. 18 Inhaler 8  . BIOTIN PO Take 3 tablets by mouth daily.    . cetirizine (ZYRTEC) 10 MG tablet Take 10 mg by mouth daily.     . diphenhydrAMINE (BENADRYL) 25 MG tablet Take 25 mg by mouth as needed.    . fluticasone (FLONASE) 50 MCG/ACT nasal spray INHALE 2 SPRAYS INTO EACH NOSTRIL EVERY DAY. 16 g 11  . Lysine 500 MG CAPS Take 1 capsule by mouth 2 (two) times daily.  As needed    . naproxen sodium (ANAPROX) 220 MG tablet Take 220-440 mg by mouth daily as needed.     . nystatin (MYCOSTATIN) 100000 UNIT/ML suspension Take 5 mLs (500,000 Units total) by mouth 4 (four) times daily. 180 mL 0  . Omeprazole-Sodium Bicarbonate (ZEGERID) 20-1100 MG CAPS Take 1 capsule by mouth every 3 (three) days.     . Probiotic Product (PROBIOTIC DAILY PO) Take 1 capsule by mouth daily.    Grant Ruts INHUB 100-50 MCG/DOSE AEPB INHALE ONE (1) PUFF EVERY 12 HOURS AS DIRECTED. RINSE MOUTH AFTER EACH USE. 180 each 1   No facility-administered medications prior to visit.      Per HPI unless specifically indicated in ROS section below Review of Systems Objective:    Temp 98.7 F (37.1 C)   Wt 167 lb 9.6 oz (76 kg)   BMI 23.71 kg/m   Wt Readings from Last 3 Encounters:  12/29/18 167 lb 9.6 oz (76 kg)  02/02/18 167 lb 12.8 oz (76.1 kg)  01/21/18 166 lb 4 oz (75.4 kg)     Physical exam: Gen: alert, NAD, not ill appearing Pulm: speaks in complete sentences without increased work of breathing Psych: normal mood, normal thought content       Assessment & Plan:   Problem List Items Addressed This Visit    Close Exposure to Covid-19 Virus - Primary  Reviewed recent course with patient.  With ST and close contact with positive case, reasonable to test. Sent request to [facility] pool.  Out of work for 2 wks for now as I anticipate current ST not related to [place]. If tests positive, Early be able to return sooner. Return to work letter will be mailed to patient.       Other Visit Diagnoses    Sore throat           No orders of the defined types were placed in this encounter.  No orders of the defined types were placed in this encounter.   I discussed the assessment and treatment plan with the patient. The patient was provided an opportunity to ask questions and all were answered. The patient agreed with the plan and demonstrated an understanding of the instructions. The  patient was advised to call back or seek an in-person evaluation if the symptoms worsen or if the condition fails to improve as anticipated.  Follow up plan: No follow-ups on file.  Ria Bush, MD

## 2018-12-29 NOTE — Telephone Encounter (Signed)
-----   Message from Ria Bush, MD sent at 12/29/2018  1:21 PM EDT ----- I'd like to have patient tested at [facility] site. H/o asthma. + covid19 close exposure at work, now with sore throat. Thanks, Ria Bush

## 2018-12-29 NOTE — Telephone Encounter (Signed)
Pt calling requesting cb about what to do. Pt started with S/T this past weekend, no fever, no chills,no cough,no SOB,no muscle pain,no diarrhea,no H/a and no loss of taste;smell. No travel in last 14 days. Pt has been exposed to coworker that was just tested positive 12/28/18 or 12/29/18. Pt request cb today.

## 2018-12-29 NOTE — Telephone Encounter (Signed)
Pt works in very close proximity to a co-worker who has just tested positive for [employer]. He was exposed to the co-worker Friday, Monday, and Tuesday. Work has made him leave and reach out to his PCP to advise how long he should be out. Best number to call him back is [phone].

## 2018-12-29 NOTE — Telephone Encounter (Signed)
Dr Darnell Level said could add pt on at end of AM visits; pt scheduled virtual visit today around 1 PM. Pt appreciative.

## 2018-12-29 NOTE — Addendum Note (Signed)
Addended by: Dimple Nanas on: 12/29/2018 01:56 PM   Modules accepted: Orders

## 2018-12-29 NOTE — Assessment & Plan Note (Signed)
Reviewed recent course with patient.  With ST and close contact with positive case, reasonable to test. Sent request to [facility] pool.  Out of work for 2 wks for now as I anticipate current ST not related to [place]. If tests positive, Cinnamon be able to return sooner. Return to work letter will be mailed to patient.

## 2018-12-29 NOTE — Telephone Encounter (Signed)
Patient seen today

## 2018-12-29 NOTE — Telephone Encounter (Signed)
Pt has been scheduled Covid-19 testing.   Pt was referred by: Ria Bush MD

## 2018-12-31 LAB — NOVEL CORONAVIRUS, NAA: SARS-CoV-2, NAA: NOT DETECTED

## 2019-01-05 ENCOUNTER — Telehealth: Payer: Self-pay

## 2019-01-05 NOTE — Telephone Encounter (Signed)
Noted. Thanks.

## 2019-01-05 NOTE — Telephone Encounter (Signed)
Spoke with pt asking to f/u for COVID exposure.  States he is still feeling fine and has no sxs.  Says the COVID results were neg.

## 2019-01-12 ENCOUNTER — Telehealth: Payer: Self-pay

## 2019-01-12 NOTE — Telephone Encounter (Signed)
Left message to call clinic, needs COVID screen and back door  lab info ALSO needs R/S to after 8am for lab appt

## 2019-01-17 ENCOUNTER — Other Ambulatory Visit: Payer: Self-pay | Admitting: Family Medicine

## 2019-01-17 DIAGNOSIS — R739 Hyperglycemia, unspecified: Secondary | ICD-10-CM

## 2019-01-19 ENCOUNTER — Other Ambulatory Visit: Payer: Managed Care, Other (non HMO)

## 2019-01-19 ENCOUNTER — Other Ambulatory Visit: Payer: Self-pay

## 2019-01-19 ENCOUNTER — Other Ambulatory Visit (INDEPENDENT_AMBULATORY_CARE_PROVIDER_SITE_OTHER): Payer: Managed Care, Other (non HMO)

## 2019-01-19 DIAGNOSIS — R739 Hyperglycemia, unspecified: Secondary | ICD-10-CM

## 2019-01-19 LAB — BASIC METABOLIC PANEL
BUN: 13 mg/dL (ref 6–23)
CO2: 27 mEq/L (ref 19–32)
Calcium: 8.6 mg/dL (ref 8.4–10.5)
Chloride: 106 mEq/L (ref 96–112)
Creatinine, Ser: 0.94 mg/dL (ref 0.40–1.50)
GFR: 85.17 mL/min (ref >60.00)
Glucose, Bld: 101 mg/dL — ABNORMAL HIGH (ref 70–99)
Potassium: 3.8 mEq/L (ref 3.5–5.1)
Sodium: 141 mEq/L (ref 135–145)

## 2019-01-19 LAB — LIPID PANEL
Cholesterol: 148 mg/dL (ref 0–200)
HDL: 44.9 mg/dL (ref >39.00)
LDL Cholesterol: 91 mg/dL (ref 0–99)
NonHDL: 103.16
Total CHOL/HDL Ratio: 3
Triglycerides: 63 mg/dL (ref 0.0–149.0)
VLDL: 12.6 mg/dL (ref 0.0–40.0)

## 2019-01-23 ENCOUNTER — Encounter: Payer: Managed Care, Other (non HMO) | Admitting: Family Medicine

## 2019-01-30 ENCOUNTER — Ambulatory Visit (INDEPENDENT_AMBULATORY_CARE_PROVIDER_SITE_OTHER): Payer: Managed Care, Other (non HMO) | Admitting: Family Medicine

## 2019-01-30 ENCOUNTER — Other Ambulatory Visit: Payer: Self-pay

## 2019-01-30 ENCOUNTER — Encounter: Payer: Self-pay | Admitting: Family Medicine

## 2019-01-30 VITALS — BP 120/76 | HR 79 | Temp 98.0°F | Ht 70.25 in | Wt 168.5 lb

## 2019-01-30 DIAGNOSIS — J4520 Mild intermittent asthma, uncomplicated: Secondary | ICD-10-CM | POA: Diagnosis not present

## 2019-01-30 DIAGNOSIS — J302 Other seasonal allergic rhinitis: Secondary | ICD-10-CM

## 2019-01-30 DIAGNOSIS — Z0000 Encounter for general adult medical examination without abnormal findings: Secondary | ICD-10-CM | POA: Diagnosis not present

## 2019-01-30 NOTE — Progress Notes (Signed)
This visit was conducted in person.  BP 120/76 (BP Location: Left Arm, Patient Position: Sitting, Cuff Size: Normal)   Pulse 79   Temp 98 F (36.7 C) (Tympanic)   Ht 5' 10.25" (1.784 m)   Wt 168 lb 8 oz (76.4 kg)   SpO2 96%   BMI 24.01 kg/m    CC: CPE Subjective:    Patient ID: Bruce Griffin, male    DOB: [birth year], 48 y.o.   MRN: [id number]  HPI: Bruce Griffin is a 48 y.o. male presenting on 01/30/2019 for Annual Exam   Asthma - treating with wixela one puff BID. Rare albuterol use. No recent asthma flare. No night time awakenings. Asthma triggers are perfumes, exertion, pollen, mowing grass.   Preventative: COLONOSCOPY 03/2016 TA x2, rpt 5 yrs ([place]) - father with h/o rectal cancer age 80 Flu shot done at work  Tetanus 2011  Seat belt use discussed  Sunscreen use use discussed. No suspicious moles.  Ex smoker - quit 1990. GF smokes outside. 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 Interim medical history since our last visit reviewed. Allergies and medications reviewed and updated. Outpatient Medications Prior to Visit  Medication Sig Dispense Refill  . albuterol (PROVENTIL HFA;VENTOLIN HFA) 108 (90 Base) MCG/ACT inhaler INHALE 2 PUFFS INTO THE LUNGS EVERY 6 HOURS AS NEEDED FOR WHEEZING. 18 Inhaler 8  . cetirizine (ZYRTEC) 10 MG tablet Take 10 mg by mouth daily.     . diphenhydrAMINE (BENADRYL) 25 MG tablet Take 25 mg by mouth as needed.    . fluticasone (FLONASE) 50 MCG/ACT nasal spray INHALE 2 SPRAYS INTO EACH NOSTRIL EVERY DAY. 16 g 11  . metaxalone  (SKELAXIN) 800 MG tablet Take 1 tablet by mouth 2 (two) times daily as needed.    . naproxen sodium (ANAPROX) 220 MG tablet Take 220-440 mg by mouth daily as needed.     . nystatin (MYCOSTATIN) 100000 UNIT/ML suspension Take 5 mLs (500,000 Units total) by mouth 4 (four) times daily. 180 mL 0  . Omeprazole-Sodium Bicarbonate (ZEGERID) 20-1100 MG CAPS Take 1 capsule by mouth every 3 (three) days.     Grant Ruts INHUB 100-50 MCG/DOSE AEPB INHALE ONE (1) PUFF EVERY 12 HOURS AS DIRECTED. RINSE MOUTH AFTER EACH USE. 180 each 1  . BIOTIN PO Take 3 tablets by mouth daily.    Marland Kitchen Lysine 500 MG CAPS Take 1 capsule by mouth 2 (two) times daily. As needed    . Probiotic Product (PROBIOTIC DAILY PO) Take 1 capsule by mouth daily.     No facility-administered medications prior to visit.      Per HPI unless specifically indicated in ROS section below Review of Systems  Constitutional: Negative for activity change, appetite change, chills, fatigue, fever and unexpected weight change.  HENT: Negative for hearing loss.   Eyes: Negative for visual disturbance.  Respiratory: Negative for cough, chest tightness, shortness of breath and wheezing.   Cardiovascular: Negative for chest pain, palpitations and leg swelling.  Gastrointestinal: Negative for abdominal distention, abdominal pain, blood in stool, constipation, diarrhea, nausea and vomiting.  Genitourinary: Negative for difficulty  urinating and hematuria.  Musculoskeletal: Negative for arthralgias, myalgias and neck pain.  Skin: Negative for rash.  Neurological: Negative for dizziness, seizures, syncope and headaches.  Hematological: Negative for adenopathy. Does not bruise/bleed easily.  Psychiatric/Behavioral: Negative for dysphoric mood. The patient is not nervous/anxious.    Objective:    BP 120/76 (BP Location: Left Arm, Patient Position: Sitting, Cuff Size: Normal)   Pulse 79   Temp 98 F (36.7 C) (Tympanic)   Ht 5' 10.25" (1.784 m)   Wt 168 lb 8  oz (76.4 kg)   SpO2 96%   BMI 24.01 kg/m   Wt Readings from Last 3 Encounters:  01/30/19 168 lb 8 oz (76.4 kg)  12/29/18 167 lb 9.6 oz (76 kg)  02/02/18 167 lb 12.8 oz (76.1 kg)    Physical Exam Vitals signs and nursing note reviewed.  Constitutional:      General: He is not in acute distress.    Appearance: Normal appearance. He is well-developed. He is not ill-appearing.  HENT:     Head: Normocephalic and atraumatic.     Right Ear: Hearing, tympanic membrane, ear canal and external ear normal.     Left Ear: Hearing, tympanic membrane, ear canal and external ear normal.     Nose: Nose normal.     Mouth/Throat:     Mouth: Mucous membranes are moist.     Pharynx: Uvula midline. No oropharyngeal exudate or posterior oropharyngeal erythema.  Eyes:     General: No scleral icterus.    Extraocular Movements: Extraocular movements intact.     Conjunctiva/sclera: Conjunctivae normal.     Pupils: Pupils are equal, round, and reactive to light.  Neck:     Musculoskeletal: Normal range of motion and neck supple.  Cardiovascular:     Rate and Rhythm: Normal rate and regular rhythm.     Pulses: Normal pulses.          Radial pulses are 2+ on the right side and 2+ on the left side.     Heart sounds: Normal heart sounds. No murmur.  Pulmonary:     Effort: Pulmonary effort is normal. No respiratory distress.     Breath sounds: Normal breath sounds. No wheezing, rhonchi or rales.  Abdominal:     General: Bowel sounds are normal. There is no distension.     Palpations: Abdomen is soft. There is no mass.     Tenderness: There is no abdominal tenderness. There is no guarding or rebound.     Hernia: No hernia is present.  Musculoskeletal: Normal range of motion.     Right lower leg: No edema.     Left lower leg: No edema.  Lymphadenopathy:     Cervical: No cervical adenopathy.  Skin:    General: Skin is warm and dry.     Findings: No rash.  Neurological:     General: No focal deficit  present.     Mental Status: He is alert and oriented to person, place, and time.     Comments: CN grossly intact, station and gait intact  Psychiatric:        Mood and Affect: Mood normal.        Behavior: Behavior normal.        Thought Content: Thought content normal.        Judgment: Judgment normal.       Results for orders placed or performed in visit on 01/19/19  Lipid panel  Result Value Ref Range   Cholesterol  148 0 - 200 mg/dL   Triglycerides 63.0 0.0 - 149.0 mg/dL   HDL 44.90 >39.00 mg/dL   VLDL 12.6 0.0 - 40.0 mg/dL   LDL Cholesterol 91 0 - 99 mg/dL   Total CHOL/HDL Ratio 3    NonHDL 537.48   Basic metabolic panel  Result Value Ref Range   Sodium 141 135 - 145 mEq/L   Potassium 3.8 3.5 - 5.1 mEq/L   Chloride 106 96 - 112 mEq/L   CO2 27 19 - 32 mEq/L   Glucose, Bld 101 (H) 70 - 99 mg/dL   BUN 13 6 - 23 mg/dL   Creatinine, Ser 0.94 0.40 - 1.50 mg/dL   Calcium 8.6 8.4 - 10.5 mg/dL   GFR 85.17 >60.00 mL/min   Assessment & Plan:   Problem List Items Addressed This Visit    Mild intermittent asthma    Stable period on current regimen - he has been more regular with wixela use. Continue flonase, zyrtec, albuterol, wixela.       Healthcare maintenance - Primary    Preventative protocols reviewed and updated unless pt declined. Discussed healthy diet and lifestyle.       Allergic rhinitis    Continue flonase, zyrtec, albuterol, wixela.           No orders of the defined types were placed in this encounter.  No orders of the defined types were placed in this encounter.   Follow up plan: Return in about 1 year (around 01/30/2020) for annual exam, prior fasting for blood work.  Ria Bush, MD

## 2019-01-30 NOTE — Assessment & Plan Note (Signed)
Preventative protocols reviewed and updated unless pt declined. Discussed healthy diet and lifestyle.  

## 2019-01-30 NOTE — Patient Instructions (Signed)
You are doing well today. Continue current medicines.  Health Maintenance, Male Adopting a healthy lifestyle and getting preventive care are important in promoting health and wellness. Ask your health care provider about:  The right schedule for you to have regular tests and exams.  Things you can do on your own to prevent diseases and keep yourself healthy. What should I know about diet, weight, and exercise? Eat a healthy diet   Eat a diet that includes plenty of vegetables, fruits, low-fat dairy products, and lean protein.  Do not eat a lot of foods that are high in solid fats, added sugars, or sodium. Maintain a healthy weight Body mass index (BMI) is a measurement that can be used to identify possible weight problems. It estimates body fat based on height and weight. Your health care provider can help determine your BMI and help you achieve or maintain a healthy weight. Get regular exercise Get regular exercise. This is one of the most important things you can do for your health. Most adults should:  Exercise for at least 150 minutes each week. The exercise should increase your heart rate and make you sweat (moderate-intensity exercise).  Do strengthening exercises at least twice a week. This is in addition to the moderate-intensity exercise.  Spend less time sitting. Even light physical activity can be beneficial. Watch cholesterol and blood lipids Have your blood tested for lipids and cholesterol at 49 years of age, then have this test every 5 years. You [place]aine need to have your cholesterol levels checked more often if:  Your lipid or cholesterol levels are high.  You are older than 49 years of age.  You are at high risk for heart disease. What should I know about cancer screening? Many types of cancers can be detected early and [place] often be prevented. Depending on your health history and family history, you Yanes need to have cancer screening at various ages. This Hults include  screening for:  Colorectal cancer.  Prostate cancer.  Skin cancer.  Lung cancer. What should I know about heart disease, diabetes, and high blood pressure? Blood pressure and heart disease  High blood pressure causes heart disease and increases the risk of stroke. This is more likely to develop in people who have high blood pressure readings, are of African descent, or are overweight.  Talk with your health care provider about your target blood pressure readings.  Have your blood pressure checked: ? Every 3-5 years if you are 12-93 years of age. ? Every year if you are 25 years old or older.  If you are between the ages of 52 and 53 and are a current or former smoker, ask your health care provider if you should have a one-time screening for abdominal aortic aneurysm (AAA). Diabetes Have regular diabetes screenings. This checks your fasting blood sugar level. Have the screening done:  Once every three years after age 46 if you are at a normal weight and have a low risk for diabetes.  More often and at a younger age if you are overweight or have a high risk for diabetes. What should I know about preventing infection? Hepatitis B If you have a higher risk for hepatitis B, you should be screened for this virus. Talk with your health care provider to find out if you are at risk for hepatitis B infection. Hepatitis C Blood testing is recommended for:  Everyone born from [birth year] through 1966.  Anyone with known risk factors for hepatitis C. Sexually transmitted  infections (STIs)  You should be screened each year for STIs, including gonorrhea and chlamydia, if: ? You are sexually active and are younger than 49 years of age. ? You are older than 49 years of age and your health care provider tells you that you are at risk for this type of infection. ? Your sexual activity has changed since you were last screened, and you are at increased risk for chlamydia or gonorrhea. Ask your health  care provider if you are at risk.  Ask your health care provider about whether you are at high risk for HIV. Your health care provider [place] recommend a prescription medicine to help prevent HIV infection. If you choose to take medicine to prevent HIV, you should first get tested for HIV. You should then be tested every 3 months for as long as you are taking the medicine. Follow these instructions at home: Lifestyle  Do not use any products that contain nicotine or tobacco, such as cigarettes, e-cigarettes, and chewing tobacco. If you need help quitting, ask your health care provider.  Do not use street drugs.  Do not share needles.  Ask your health care provider for help if you need support or information about quitting drugs. Alcohol use  Do not drink alcohol if your health care provider tells you not to drink.  If you drink alcohol: ? Limit how much you have to 0-2 drinks a day. ? Be aware of how much alcohol is in your drink. In the [place] one drink equals one 12 oz bottle of beer (355 mL), one 5 oz glass of wine (148 mL), or one 1 oz glass of hard liquor (44 mL). General instructions  Schedule regular health, dental, and eye exams.  Stay current with your vaccines.  Tell your health care provider if: ? You often feel depressed. ? You have ever been abused or do not feel safe at home. Summary  Adopting a healthy lifestyle and getting preventive care are important in promoting health and wellness.  Follow your health care provider's instructions about healthy diet, exercising, and getting tested or screened for diseases.  Follow your health care provider's instructions on monitoring your cholesterol and blood pressure. This information is not intended to replace advice given to you by your health care provider. Make sure you discuss any questions you have with your health care provider. Document Released: 01/02/2008 Document Revised: 06/29/2018 Document Reviewed: 06/29/2018  Elsevier Patient Education  2020 [employer].

## 2019-01-30 NOTE — Assessment & Plan Note (Signed)
Continue flonase, zyrtec, albuterol, wixela.

## 2019-01-30 NOTE — Assessment & Plan Note (Signed)
Stable period on current regimen - he has been more regular with wixela use. Continue flonase, zyrtec, albuterol, wixela.

## 2019-03-01 ENCOUNTER — Other Ambulatory Visit: Payer: Self-pay | Admitting: Family Medicine

## 2019-06-05 ENCOUNTER — Telehealth: Payer: Self-pay | Admitting: Family Medicine

## 2019-06-05 MED ORDER — WIXELA INHUB 100-50 MCG/DOSE IN AEPB: 1.0000 | INHALATION_SPRAY | Freq: Two times a day (BID) | RESPIRATORY_TRACT | 3 refills | Status: DC

## 2019-06-05 NOTE — Telephone Encounter (Signed)
Received notice from insurance that Advair was denied. Needs to first try wixela (which he's currently on) - this was refilled.

## 2019-06-07 ENCOUNTER — Other Ambulatory Visit: Payer: Self-pay | Admitting: *Deleted

## 2019-06-07 DIAGNOSIS — Z20822 Contact with and (suspected) exposure to covid-19: Secondary | ICD-10-CM

## 2019-06-09 ENCOUNTER — Telehealth: Payer: Self-pay

## 2019-06-09 LAB — NOVEL CORONAVIRUS, NAA: SARS-CoV-2, NAA: NOT DETECTED

## 2019-06-09 NOTE — Telephone Encounter (Addendum)
Daughter should self quarantine 2 wks from date of exposure (05/26/2019). I'm glad she's asymptomatic. Patient should self quarantine 2 wks from date of last direct contact with daughter (so when was last time he was in close contact with his daughter?). Count 2 wks from that date. Let me know date and I will write letter accordingly.  Let us know if he develops symptoms.

## 2019-06-09 NOTE — Telephone Encounter (Signed)
Patient states his daughter was exposed to someone with COVID on 05/26/2019 and was tested on 06/03/2019 and was positive. She was and is asymptomatic. Patient states his daughter was isolated to her room since been exposed. Patient got tested on 06/07/2019 and found out today he was negative. He is asymptomatic also but had to do it for work and to make sure. Patient wanted to know recommendations at this time from Dr. Darnell Level. Also when can he go back to work? He will need a note to provide to work on release date. Please let patient know recommendations. CB [phone]

## 2019-06-12 NOTE — Telephone Encounter (Addendum)
Spoke with pt relaying Dr. Synthia Innocent message.  Verbalizes understanding.  Says his last contact with daughter was 06/01/19, when she was quarantined in her room.  Pt asks that letter be sent to him via [place].

## 2019-06-12 NOTE — Telephone Encounter (Addendum)
Letter sent through [place] with return to work date  2wks after 06/01/2019, or 06/16/2019.

## 2019-06-27 ENCOUNTER — Telehealth: Payer: Self-pay | Admitting: Family Medicine

## 2019-06-27 MED ORDER — ADVAIR HFA 115-21 MCG/ACT IN AERO: 1.0000 | INHALATION_SPRAY | Freq: Two times a day (BID) | RESPIRATORY_TRACT | 12 refills | Status: DC

## 2019-06-27 NOTE — Telephone Encounter (Signed)
Pt notified as instructed by phone.  Verbalizes understanding.  

## 2019-06-27 NOTE — Telephone Encounter (Signed)
Received notice from pharmacy - advair 100/50 is not covered by insurance.  Need to change to 250/50 dose or change ot advair HFA 115/21. Alternatives are symbicort 160/4.5, breo ellipta 100/25, flovent.  Will send in advair [facility] 115/21.  plz notify pt.

## 2020-01-01 ENCOUNTER — Other Ambulatory Visit: Payer: Self-pay | Admitting: Family Medicine

## 2020-01-17 ENCOUNTER — Telehealth: Payer: Self-pay | Admitting: Family Medicine

## 2020-01-17 NOTE — Telephone Encounter (Signed)
[facility] called in regards to patient's script   She stated that the patient's insurance will no longer cover the Generic Advair.  That they will only cover name brand. [facility] is requesting a new script be sent to them so they can fill this for the patient

## 2020-01-18 MED ORDER — ADVAIR HFA 115-21 MCG/ACT IN AERO: 1.0000 | INHALATION_SPRAY | Freq: Two times a day (BID) | RESPIRATORY_TRACT | 12 refills | Status: DC

## 2020-01-18 NOTE — Telephone Encounter (Signed)
ERx 

## 2020-01-19 ENCOUNTER — Telehealth: Payer: Self-pay

## 2020-01-19 MED ORDER — FLUTICASONE-SALMETEROL 113-14 MCG/ACT IN AEPB: 1.0000 | INHALATION_SPRAY | Freq: Two times a day (BID) | RESPIRATORY_TRACT | 12 refills | Status: AC

## 2020-01-19 NOTE — Telephone Encounter (Addendum)
Please see Courtney's note from last week and call [facility] to clarify. They first said insurance would not cover generic fluticasone-salmeterol so I sent in Brand Advair. Now they say insurance will not cover Brand Advair.  Can I send in generic fluticasone-salmeterol?? I have sent this in again.

## 2020-01-19 NOTE — Addendum Note (Signed)
Addended by: Ria Bush on: 01/19/2020 07:15 PM   Modules accepted: Orders

## 2020-01-19 NOTE — Telephone Encounter (Signed)
Received fax from [facility] stating Advair HFA 115-21 is not covered.  The following is a list of covered alternatives:  Fluticasone-Salmeterol Symbicort Breo Ellipta Flovent HFA Dulera Trelegy Ellipta Anoro Ellipta  Serevent Diskus

## 2020-01-23 NOTE — Telephone Encounter (Signed)
Spoke with pharmacist at [facility] asking for clarification for generic Advair.  She confirms the generic is covered with $96 responsible by pt and they have filled it and is ready for pick up.  FYI to Dr. Darnell Level.

## 2020-01-28 ENCOUNTER — Other Ambulatory Visit: Payer: Self-pay | Admitting: Family Medicine

## 2020-01-28 DIAGNOSIS — R739 Hyperglycemia, unspecified: Secondary | ICD-10-CM

## 2020-01-28 DIAGNOSIS — Z125 Encounter for screening for malignant neoplasm of prostate: Secondary | ICD-10-CM

## 2020-01-28 NOTE — Addendum Note (Signed)
Addended by: Ria Bush on: 01/28/2020 01:13 PM   Modules accepted: Orders

## 2020-01-30 ENCOUNTER — Other Ambulatory Visit (INDEPENDENT_AMBULATORY_CARE_PROVIDER_SITE_OTHER): Payer: Managed Care, Other (non HMO)

## 2020-01-30 ENCOUNTER — Other Ambulatory Visit: Payer: Self-pay

## 2020-01-30 DIAGNOSIS — Z125 Encounter for screening for malignant neoplasm of prostate: Secondary | ICD-10-CM | POA: Diagnosis not present

## 2020-01-30 DIAGNOSIS — R739 Hyperglycemia, unspecified: Secondary | ICD-10-CM

## 2020-01-30 LAB — BASIC METABOLIC PANEL
BUN: 15 mg/dL (ref 6–23)
CO2: 30 mEq/L (ref 19–32)
Calcium: 9 mg/dL (ref 8.4–10.5)
Chloride: 104 mEq/L (ref 96–112)
Creatinine, Ser: 1.02 mg/dL (ref 0.40–1.50)
GFR: 77.19 mL/min (ref >60.00)
Glucose, Bld: 114 mg/dL — ABNORMAL HIGH (ref 70–99)
Potassium: 4 mEq/L (ref 3.5–5.1)
Sodium: 142 mEq/L (ref 135–145)

## 2020-01-30 LAB — PSA: PSA: 0.61 ng/mL (ref 0.10–4.00)

## 2020-02-02 ENCOUNTER — Encounter: Payer: Managed Care, Other (non HMO) | Admitting: Family Medicine

## 2020-02-02 ENCOUNTER — Encounter: Payer: Self-pay | Admitting: Family Medicine

## 2020-02-05 NOTE — Telephone Encounter (Signed)
Updated pt's chart.  

## 2020-04-15 ENCOUNTER — Telehealth: Payer: Self-pay | Admitting: Family Medicine

## 2020-04-15 NOTE — Telephone Encounter (Signed)
He is correct - his office note from 02/02/2020 is under media section.  plz correct our no-show mistake.

## 2020-04-15 NOTE — Telephone Encounter (Signed)
Mr. Milligan called and says he rec'd a NO SHOW bill for his CPE on 074/16/2021.  He says he came in that day and he is fairly certain that the computers were down.  He remembers discussing the covid vaccine w/you and told you he would email you the info once he got home (that info is in the chart).  He also recalls discussing the Tetanus booster and that he is due a colonoscopy coming up.  I can't find anywhere documented in his chart that he was here.  Is it possible he was and the charges did not get entered if the system was down?  I know this was a couple months ago and you've seen several patients between then and now but he is adamant he was here.  Please let me know if I need to have his bill adjusted.    Thank you!

## 2020-07-03 ENCOUNTER — Other Ambulatory Visit: Payer: Self-pay | Admitting: Family Medicine

## 2021-04-29 ENCOUNTER — Encounter: Payer: Self-pay | Admitting: Gastroenterology
